# Patient Record
Sex: Female | Born: 1985
Health system: Southern US, Community
[De-identification: ages and names within clinical notes are randomized; demographics above are authoritative.]

## PROBLEM LIST (undated history)

## (undated) DIAGNOSIS — F329 Major depressive disorder, single episode, unspecified: Secondary | ICD-10-CM

## (undated) DIAGNOSIS — F32A Depression, unspecified: Secondary | ICD-10-CM

## (undated) DIAGNOSIS — F419 Anxiety disorder, unspecified: Secondary | ICD-10-CM

---
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
## 2004-04-14 ENCOUNTER — Inpatient Hospital Stay (HOSPITAL_COMMUNITY): Admission: AD | Admit: 2004-04-14 | Discharge: 2004-04-14 | Payer: Self-pay | Admitting: Obstetrics

## 2004-04-22 ENCOUNTER — Inpatient Hospital Stay (HOSPITAL_COMMUNITY): Admission: AD | Admit: 2004-04-22 | Discharge: 2004-04-26 | Payer: Self-pay | Admitting: Obstetrics

## 2004-08-09 ENCOUNTER — Emergency Department (HOSPITAL_COMMUNITY): Admission: EM | Admit: 2004-08-09 | Discharge: 2004-08-09 | Payer: Self-pay | Admitting: Emergency Medicine

## 2004-12-27 ENCOUNTER — Emergency Department (HOSPITAL_COMMUNITY): Admission: EM | Admit: 2004-12-27 | Discharge: 2004-12-27 | Payer: Self-pay | Admitting: Family Medicine

## 2005-02-08 ENCOUNTER — Emergency Department (HOSPITAL_COMMUNITY): Admission: EM | Admit: 2005-02-08 | Discharge: 2005-02-08 | Payer: Self-pay | Admitting: Family Medicine

## 2005-05-06 ENCOUNTER — Emergency Department (HOSPITAL_COMMUNITY): Admission: EM | Admit: 2005-05-06 | Discharge: 2005-05-06 | Payer: Self-pay | Admitting: Emergency Medicine

## 2006-03-07 ENCOUNTER — Ambulatory Visit: Payer: Self-pay | Admitting: Gynecology

## 2006-04-17 ENCOUNTER — Inpatient Hospital Stay (HOSPITAL_COMMUNITY): Admission: AD | Admit: 2006-04-17 | Discharge: 2006-04-17 | Payer: Self-pay | Admitting: Obstetrics and Gynecology

## 2006-06-17 ENCOUNTER — Inpatient Hospital Stay (HOSPITAL_COMMUNITY): Admission: AD | Admit: 2006-06-17 | Discharge: 2006-06-17 | Payer: Self-pay | Admitting: Obstetrics and Gynecology

## 2006-07-08 ENCOUNTER — Emergency Department (HOSPITAL_COMMUNITY): Admission: EM | Admit: 2006-07-08 | Discharge: 2006-07-08 | Payer: Self-pay | Admitting: Family Medicine

## 2006-10-11 ENCOUNTER — Inpatient Hospital Stay (HOSPITAL_COMMUNITY): Admission: RE | Admit: 2006-10-11 | Discharge: 2006-10-14 | Payer: Self-pay | Admitting: Obstetrics and Gynecology

## 2007-03-25 ENCOUNTER — Emergency Department (HOSPITAL_COMMUNITY): Admission: EM | Admit: 2007-03-25 | Discharge: 2007-03-25 | Payer: Self-pay | Admitting: Family Medicine

## 2010-02-18 ENCOUNTER — Emergency Department (HOSPITAL_COMMUNITY): Admission: EM | Admit: 2010-02-18 | Discharge: 2010-02-18 | Payer: Self-pay | Admitting: Family Medicine

## 2011-01-10 LAB — WET PREP, GENITAL
Clue Cells Wet Prep HPF POC: NONE SEEN
Trich, Wet Prep: NONE SEEN
WBC, Wet Prep HPF POC: NONE SEEN
Yeast Wet Prep HPF POC: NONE SEEN

## 2011-01-10 LAB — POCT URINALYSIS DIP (DEVICE)
Bilirubin Urine: NEGATIVE
Glucose, UA: NEGATIVE mg/dL
Hgb urine dipstick: NEGATIVE
Ketones, ur: NEGATIVE mg/dL
Nitrite: NEGATIVE
Protein, ur: NEGATIVE mg/dL
Specific Gravity, Urine: 1.015 (ref 1.005–1.030)
Urobilinogen, UA: 0.2 mg/dL (ref 0.0–1.0)
pH: 7 (ref 5.0–8.0)

## 2011-01-10 LAB — GC/CHLAMYDIA PROBE AMP, GENITAL
Chlamydia, DNA Probe: NEGATIVE
GC Probe Amp, Genital: NEGATIVE

## 2011-01-10 LAB — POCT PREGNANCY, URINE: Preg Test, Ur: NEGATIVE

## 2011-03-10 NOTE — Discharge Summary (Signed)
NAME:  Ashley Dawson, Ashley Dawson                       ACCOUNT NO.:  [id number]   MEDICAL RECORD NO.:  [id number]                   PATIENT TYPE:  INP   LOCATION:  9140                                 [facility]ILITY:  [facility]   PHYSICIAN:  Kathreen Cosier, M.D.           DATE OF BIRTH:  1986/07/28   DATE OF ADMISSION:  04/22/2004  DATE OF DISCHARGE:                                 DISCHARGE SUMMARY   HOSPITAL COURSE:  The patient is a 25 year old primigravida with [facility] April 22, 2004 in for induction.  Cervix was closed, vertex -1.  She had a negative  GBS.  She had Cytotec placed x2 on the night of July 1 and by 6 a.m. on July  2 she was 1 cm, 100%, vertex, -1 to -2.  Membranes were artificially  ruptured.  The fluid was meconium stained.  The patient received Pitocin  stimulation and eventually underwent a primary low transverse cesarean  section for failure to progress in labor.  She delivered a female, Apgars 9  and 9, weighing 7 pounds 7 ounces from the OP position on July 2.  Postoperatively she did well.  Her hemoglobin on postoperative day #2 was  11.6.  She was discharged on postoperative day #3, ambulatory, on a regular  diet, without complaints, on Tylox for pain, to see me in 6 weeks.   DISCHARGE DIAGNOSIS:  Status post primary low transverse cesarean section at  term for cephalopelvic disproportion.                                               Kathreen Cosier, M.D.    BAM/MEDQ  D:  04/26/2004  T:  04/26/2004  Job:  [id number]

## 2011-03-10 NOTE — Op Note (Signed)
NAME:  Ashley Dawson, Ashley Dawson                       ACCOUNT NO.:  [id number]   MEDICAL RECORD NO.:  [id number]                   PATIENT TYPE:  INP   LOCATION:  9140                                 [facility]   PHYSICIAN:  Kathreen Cosier, M.D.           DATE OF BIRTH:  [birth year]   DATE OF PROCEDURE:  04/23/2004  DATE OF DISCHARGE:                                 OPERATIVE REPORT   PREOPERATIVE DIAGNOSIS:  Failure to progress in labor.   ANESTHESIA:  Epidural.   SURGEON:  Dr. Gaynell Face.   PROCEDURE:  The patient was placed on the operating room table in the supine  position.  The abdomen was prepped and draped.  The bladder was emptied with  a Foley catheter.  A transverse suprapubic incision was made, carried down  through the rectus fascia.  The fascia was cleaned and incised throughout  the length of the incision.  The recti muscles were retracted laterally.  The peritoneum was incised longitudinally.  A transverse incision was made  in the viscera and peritoneum above the bladder and the bladder mobilized  inferiorly.  A transverse lower uterine incision was made.  The fluid was  cleared.  The patient was delivered from the OP position, a female weighing  7 pounds 7 ounces.  Apgars were 9 and 9, given to the team in attendance.  The placenta was posterior and removed manually.  The uterine cavity was  cleaned with dry laps.  The uterine incision was closed in a wide layer with  continuous #1 chromic.  Hemostasis was satisfactory.  The bladder flap was  reattached with 2-0 chromic.  The uterus was well contracted.  Tubes and  ovaries normal.  The abdomen was closed in layers.  The peritoneum was  continuously closed with 2-0 chromic.  The fascia with continuous 2-0 Dexon  and the skin closed with subcuticular stitch of 3-0 Monocryl.  Blood loss  was 600 cc.                                               Kathreen Cosier, M.D.    BAM/MEDQ  D:  04/23/2004  T:  04/25/2004   Job:  [id number]

## 2011-03-10 NOTE — Op Note (Signed)
NAMEADALIE, MAND             ACCOUNT NO.:  [id number]   MEDICAL RECORD NO.:  [id number]          PATIENT TYPE:  INP   LOCATION:  9131                          [facility]ILITY[facility]  [facility]   PHYSICIAN:  Janine Limbo, M.D.DATE OF BIRTH:  06-13-1986   DATE OF PROCEDURE:  10/11/2006  DATE OF DISCHARGE:                               OPERATIVE REPORT   PREOPERATIVE DIAGNOSIS:  1. Term intrauterine gestation.  2. Prior cesarean section.  3. Desires repeat cesarean section.  4. Obesity (weight is 273 pounds, height is 5 feet 2 inches).  5. Narrow pelvic outlet.   POSTOPERATIVE DIAGNOSIS:  1. Term intrauterine gestation.  2. Prior cesarean section.  3. Desires repeat cesarean section.  4. Obesity (weight is 273 pounds, height is 5 feet 2 inches).  5. Narrow pelvic outlet.   PROCEDURE:  Repeat low transverse cesarean section.   SURGEON:  Janine Limbo, M.D.   FIRST ASSISTANT:  Marie L. Williams, C.N.M.   ANESTHESIA:  Spinal.   DISPOSITION:  [name] is a 25 year old female, gravida 2, para 1-0-0-  1, who presents at [redacted] weeks gestation (EDC is October 13, 2006) for  repeat cesarean section.  The patient has had a prior cesarean section.  On examination, the patient's cervix was noted to be closed, 25% at  base, and the fetal head is at a -3 station.  The pelvic outlet was  noted to be narrow.  The patient understands the indications for her  surgical procedure and she accepts the risks of, but not limited to,  anesthetic complications, bleeding, infection, and possible damage to  surrounding organs.   FINDINGS:  A 7 pounds 14 ounces female infant Heloise Purpura) was delivered from  a cephalic presentation.  The Apgars were 9 at 1 minute and 9 at 5  minutes.  The uterus, fallopian tubes, and ovaries were normal for the  gravid state.   DESCRIPTION OF PROCEDURE:  The patient was taken to the operating room  where a spinal anesthetic was given.  The patient's abdomen, perineum,  and  vagina were prepped with multiple layers of Betadine.  A Foley  catheter was placed in the bladder.  The patient was then sterilely  draped.  The lower abdomen was injected with 10 mL of 0.5% Marcaine with  epinephrine.  A low transverse incision was made in the abdomen and  carried sharply through the subcutaneous tissue, fascia, and the  anterior peritoneum.  The bladder flap was developed.  An incision was  made in the lower uterine segment and extended in a low transverse  fashion.  The fetal head was delivered with the assistance of a Mityvac  vacuum extractor.  The mouth and nose were suctioned.  The remainder of  the infant was then delivered.  The cord was clamped and cut and the  infant was handed to the awaiting pediatric team.  The placenta was  removed and routine cord blood studies were obtained.  The placenta was  then sent to labor and delivery.  The uterine cavity was cleaned of  amniotic fluid, clotted blood, and membranes.  The uterine  incision was  closed using a running locking suture of 2-0 Vicryl followed by an  imbricating suture of 2-0 Vicryl.  The pelvis was vigorously irrigated.  Hemostasis was adequate throughout.  The anterior peritoneum and the  abdominal musculature were reapproximated in the midline using 2-0  Vicryl.  The fascia and the abdominal musculature were irrigated.  The  fascia was closed using a running suture of 0 Vicryl followed by three  interrupted sutures of 0 Vicryl.  The subcutaneous layer was noted to be  hemostatic.  A Jackson-Pratt drain was placed in the subcutaneous layer  and brought out through the left lower quadrant.  The subcutaneous layer  was closed using a running suture of 0 Vicryl.  The skin was  reapproximated using a subcuticular suture of 3-0 Monocryl.  The Al Pimple drain was sutured into place using 3-0 silk.  Sponge, needle, and  instrument counts were correct on two occasions.  The estimated blood  loss for the  procedure was approximately 700 mL.  The estimated urine  output was 200 mL and it was clear.  The patient was taken to the  recovery room in stable condition.  The infant was taken to the full  term nursery in stable condition.      Janine Limbo, M.D.  Electronically Signed     AVS/MEDQ  D:  10/11/2006  T:  10/11/2006  Job:  [id number]

## 2011-03-10 NOTE — H&P (Signed)
NAME:  Ashley Dawson, Ashley Dawson                       ACCOUNT NO.:  [id number]   MEDICAL RECORD NO.:  [id number]                   PATIENT TYPE:  INP   LOCATION:  9140                                 [facility]   PHYSICIAN:  Kathreen Cosier, M.D.           DATE OF BIRTH:  06-Jan-1986   DATE OF ADMISSION:  04/22/2004  DATE OF DISCHARGE:                                HISTORY & PHYSICAL   HISTORY OF PRESENT ILLNESS:  The patient is a 25 year old gravida 1  estimated date of confinement April 22, 2004 who was admitted for induction.  Cervix was closed, vertex minus 1, negative GBS. On the night of admission,  she had Cytotec placed x2 and by 6:00 a.m. on April 23, 2004 she was 1 cm,  100% vertex, minus 1 to 2 station. Membranes were ruptured artificially and  the fluid was slightly meconium stained. She was placed on Pitocin. She was  having irregular contractions, so she had Pitocin stimulation. The patient  progressed to 6 cm and over the next 4 hours in good labor, did not make any  further progress. Temperature was 101 plus. Given Tylenol and Ampicillin IV  2 grams. It was decided that she would be delivered by cesarean section for  failure to progress in labor.   PHYSICAL EXAMINATION:  GENERAL:  A well developed female in labor.  HEENT:  Negative.  LUNGS:  Clear.  HEART:  Regular rhythm. No murmurs. No gallops.  BREASTS:  Engorged.  ABDOMEN:  Term size uterus. Estimated fetal weight 7 pounds 6 ounces.  PELVIC:  Cervix as described above.  EXTREMITIES:  Negative.                                               Kathreen Cosier, M.D.    BAM/MEDQ  D:  04/23/2004  T:  04/24/2004  Job:  [id number]

## 2011-03-10 NOTE — H&P (Signed)
NAMECAMIL, Ashley Dawson             ACCOUNT NO.:  [id number]   MEDICAL RECORD NO.:  [id number]          PATIENT TYPE:  INP   LOCATION:  NA                            [facility]   PHYSICIAN:  Janine Limbo, M.D.DATE OF BIRTH:  09-10-1986   DATE OF ADMISSION:  10/11/2006  DATE OF DISCHARGE:                              HISTORY & PHYSICAL   HISTORY OF PRESENT ILLNESS:  Ashley Dawson is a 25 year old female, gravida  2, para 1, 0, 0, 1, who presents at [redacted] weeks gestation (EDC is October 11, 2006) for repeat cesarean section. The patient has been followed at  the [facility] and [facility] of [facility]. The pregnancy has been largely uncomplicated. The  patient has had a prior cesarean section for failure to progress in  labor.   OBSTETRICAL HISTORY:  The patient had a cesarean section at term in 2005  where she delivered a 7 pound, 7 ounce female infant. She failed to  progress in labor.   ALLERGIES:  No known drug allergies.   PAST MEDICAL HISTORY:  The patient denies hypertension and diabetes. She  does have a history of sickle cell trait.   REVIEW OF SYSTEMS:  Normal pregnancy complaints.   SOCIAL HISTORY:  The patient denies cigarette use, alcohol use, and  recreational drug use.   FAMILY HISTORY:  Noncontributory.   PHYSICAL EXAMINATION:  VITAL SIGNS: Height is 5 feet, 2 inches, weight  is 273 pounds.  HEENT: Within normal limits.  CHEST: Clear.  HEART: Regular rate and rhythm.  BREASTS: Are without masses.  ABDOMEN: Gravid with a fundal height of 40 cm.  EXTREMITIES: Are grossly normal.  NEUROLOGIC EXAM: Grossly normal.  PELVIC EXAM: Cervix is closed, 25% effaced, and -3 in station.   LABORATORY VALUES:  Blood type is O positive, antibody screen negative.  Sickle cell shows sickle cell trait. VDRL nonreactive. Rubella positive.  HBSAG negative. HIV nonreactive. GC negative, Chlamydia negative. Alpha  fetoprotein  screen within normal limits. Third trimester beta strep is  negative. Third trimester gonorrhea negative, third trimester Chlamydia  is negative.   ASSESSMENT:  1. Term intrauterine gestation.  2. Prior cesarean section.  3. Desires repeat cesarean section.  4. Obesity.  5. Unfavorable cervix and narrow pelvic outlet.   PLAN:  The patient will undergo a repeat cesarean section. She  understands the indications for her surgical procedure and she accepts  the risk of, but not limited to, anesthetic complications, bleeding,  infections, and possible damage to the surrounding organs.      Janine Limbo, M.D.  Electronically Signed     AVS/MEDQ  D:  10/10/2006  T:  10/10/2006  Job:  [id number]

## 2011-03-10 NOTE — Discharge Summary (Signed)
NAMEHOLIDAY, MCMENAMIN             ACCOUNT NO.:  [id number]   MEDICAL RECORD NO.:  [id number]          PATIENT TYPE:  INP   LOCATION:  9131                          [facility]   PHYSICIAN:  Janine Limbo, M.D.DATE OF BIRTH:  1985/10/24   DATE OF ADMISSION:  10/11/2006  DATE OF DISCHARGE:  10/14/2006                               DISCHARGE SUMMARY   ADMITTING DIAGNOSES:  1. Intrauterine pregnancy at term.  2. Previous cesarean section, desires repeat.  3. Obesity and narrow pelvis.   PROCEDURE:  Repeat low transverse cesarean section.   DISCHARGE DIAGNOSES:  1. Intrauterine pregnancy at term, delivered.  2. Previous cesarean  section, desires repeat.  3. Obesity with narrow pelvis.  4. Repeat low transverse cesarean section.   [name] is a 25 year old gravida 2, para 2-0-0-2 who presented for  repeat cesarean delivery on October 11, 2006.  This was accomplished by  Dr. Marline Backbone as surgeon.  The patient gave birth to a 7 pound 14  ounce female infant named Jayden with Apgar scores of 9 at 1-minute and 9  at 5 minutes.  The patient's vital signs have remained stable.  She has  been afebrile.  Her hemoglobin on the first postoperative day is 10.8.  Her incision is clean, dry and intact.  Her Jackson-Pratt drain was  removed easily on this her third postoperative day, and she is judged to  be in satisfactory condition for discharge.   DISCHARGE INSTRUCTIONS:  Per [facility] handout.   DISCHARGE MEDICATIONS:  1. Motrin 600 mg p.o. q.6 h p.r.n. pain.  2. Tylox one to two p.o. q.3-4 h p.r.n. pain.  3. Prenatal vitamins.      Rica Koyanagi, C.N.M.      Janine Limbo, M.D.  Electronically Signed    SDM/MEDQ  D:  10/14/2006  T:  10/14/2006  Job:  [id number]

## 2011-08-10 LAB — CULTURE, ROUTINE-ABSCESS: Gram Stain: NONE SEEN

## 2012-08-23 ENCOUNTER — Other Ambulatory Visit (HOSPITAL_COMMUNITY)
Admission: RE | Admit: 2012-08-23 | Discharge: 2012-08-23 | Disposition: A | Discharge status: Home / Self Care | Payer: BC Managed Care – PPO | Source: Ambulatory Visit | Attending: Family Medicine | Admitting: Family Medicine

## 2012-08-23 DIAGNOSIS — Z124 Encounter for screening for malignant neoplasm of cervix: Secondary | ICD-10-CM | POA: Insufficient documentation

## 2012-08-23 DIAGNOSIS — Z113 Encounter for screening for infections with a predominantly sexual mode of transmission: Secondary | ICD-10-CM | POA: Insufficient documentation

## 2013-06-09 ENCOUNTER — Other Ambulatory Visit: Payer: Self-pay | Admitting: Family Medicine

## 2013-06-09 DIAGNOSIS — N644 Mastodynia: Secondary | ICD-10-CM

## 2013-06-09 DIAGNOSIS — R2231 Localized swelling, mass and lump, right upper limb: Secondary | ICD-10-CM

## 2013-06-16 ENCOUNTER — Ambulatory Visit
Admission: RE | Admit: 2013-06-16 | Discharge: 2013-06-16 | Disposition: A | Discharge status: Home / Self Care | Payer: BC Managed Care – PPO | Source: Ambulatory Visit | Attending: Family Medicine | Admitting: Family Medicine

## 2013-06-16 DIAGNOSIS — N644 Mastodynia: Secondary | ICD-10-CM

## 2013-06-16 DIAGNOSIS — R2231 Localized swelling, mass and lump, right upper limb: Secondary | ICD-10-CM

## 2014-11-27 ENCOUNTER — Other Ambulatory Visit (HOSPITAL_COMMUNITY)
Admission: RE | Admit: 2014-11-27 | Discharge: 2014-11-27 | Disposition: A | Discharge status: Home / Self Care | Payer: BC Managed Care – PPO | Source: Ambulatory Visit | Attending: Family Medicine | Admitting: Family Medicine

## 2014-11-27 ENCOUNTER — Other Ambulatory Visit: Payer: Self-pay | Admitting: Family Medicine

## 2014-11-27 DIAGNOSIS — Z01419 Encounter for gynecological examination (general) (routine) without abnormal findings: Secondary | ICD-10-CM | POA: Diagnosis not present

## 2014-11-27 DIAGNOSIS — Z113 Encounter for screening for infections with a predominantly sexual mode of transmission: Secondary | ICD-10-CM | POA: Insufficient documentation

## 2014-12-01 LAB — CYTOLOGY - PAP

## 2016-08-01 ENCOUNTER — Ambulatory Visit (HOSPITAL_COMMUNITY)
Admission: EM | Admit: 2016-08-01 | Discharge: 2016-08-01 | Disposition: A | Discharge status: Home / Self Care | Payer: BC Managed Care – PPO | Attending: Physician Assistant | Admitting: Physician Assistant

## 2016-08-01 ENCOUNTER — Encounter (HOSPITAL_COMMUNITY): Payer: Self-pay | Admitting: Emergency Medicine

## 2016-08-01 DIAGNOSIS — R238 Other skin changes: Secondary | ICD-10-CM | POA: Diagnosis not present

## 2016-08-01 MED ORDER — MUPIROCIN CALCIUM 2 % EX CREA: 1.0000 "application " | TOPICAL_CREAM | Freq: Two times a day (BID) | CUTANEOUS | 0 refills | Status: DC

## 2016-08-01 NOTE — Discharge Instructions (Signed)
Use antibiotic ointment to the sore area twice daily.   Follow up with GYN if symptoms persist.

## 2016-08-01 NOTE — ED Notes (Signed)
D/c by Frank Patrick, PA  

## 2016-08-01 NOTE — ED Provider Notes (Signed)
CSN: [id number]     Arrival date & time 08/01/16  1008 History   First MD Initiated Contact with Patient 08/01/16 1105     Chief Complaint  Patient presents with  . Vaginitis   (Consider location/radiation/quality/duration/timing/severity/associated sxs/prior Treatment) HPI This is a new problem 30 y/o female with labia swelling Friday and Saturday, now resolved. Also sore area outer labia no change in sexual partner, no discharge.  History reviewed. No pertinent past medical history. History reviewed. No pertinent surgical history. History reviewed. No pertinent family history. Social History  Substance Use Topics  . Smoking status: Never Smoker  . Smokeless tobacco: Never Used  . Alcohol use No   OB History    No data available     Review of Systems  Denies: HEADACHE, NAUSEA, ABDOMINAL PAIN, CHEST PAIN, CONGESTION, DYSURIA, SHORTNESS OF BREATH  Allergies  Review of patient's allergies indicates no known allergies.  Home Medications   Prior to Admission medications   Not on File   Meds Ordered and Administered this Visit  Medications - No data to display  BP 103/78 (BP Location: Right Arm)   Pulse 84   Temp 98.6 F (37 C) (Oral)   Resp 16   SpO2 100%  No data found.   Physical Exam NURSES NOTES AND VITAL SIGNS REVIEWED. CONSTITUTIONAL: Well developed, well nourished, no acute distress HEENT: normocephalic, atraumatic EYES: Conjunctiva normal NECK:normal ROM, supple, no adenopathy PULMONARY:No respiratory distress, normal effort ABDOMINAL: Soft, ND, NT BS+, No CVAT MUSCULOSKELETAL: Normal ROM of all extremities,  SKIN: warm and dry without rash PSYCHIATRIC: Mood and affect, behavior are normal NURSES NOTES AND VITAL SIGNS REVIEWED. CONSTITUTIONAL: Well developed, well nourished, no acute distress HEENT: normocephalic, atraumatic, right and left TM's are normal EYES: Conjunctiva normal NECK:normal ROM, supple, no adenopathy PULMONARY:No respiratory  distress, normal effort Exam is performed with patient's permission Female nursing staff present to chaperone.  Perineum: clean, dry without lesions, no groin or inguinal Lymphadenopathy; urethra . No caruncle or prolapse noted. Right medial thigh near right labia is a red raw area the chaffing. No signs of herpes or other infection.      Urgent Care Course   Clinical Course    Procedures (including critical care time)  Labs Review Labs Reviewed - No data to display  Imaging Review No results found.   Visual Acuity Review  Right Eye Distance:   Left Eye Distance:   Bilateral Distance:    Right Eye Near:   Left Eye Near:    Bilateral Near:         MDM   1. Skin irritation     Patient is reassured that there are no issues that require transfer to higher level of care at this time or additional tests. Patient is advised to continue home symptomatic treatment. Patient is advised that if there are new or worsening symptoms to attend the emergency department, contact primary care provider, or return to UC. Instructions of care provided discharged home in stable condition.    THIS NOTE WAS GENERATED USING A VOICE RECOGNITION SOFTWARE PROGRAM. ALL REASONABLE EFFORTS  WERE MADE TO PROOFREAD THIS DOCUMENT FOR ACCURACY.  I have verbally reviewed the discharge instructions with the patient. A printed AVS was given to the patient.  All questions were answered prior to discharge.      Tharon AquasFrank C Brenda Samano, PA 08/01/16 1113

## 2016-08-01 NOTE — ED Triage Notes (Signed)
Pt is here for vaginal irritation/itching onset 2 weeks  Denies vag d/c, urinary sx, fevers, chills, abd/back pain  Pt is SA in a monogamous relationship  A&O x4... NAD

## 2017-05-08 ENCOUNTER — Encounter (HOSPITAL_COMMUNITY): Payer: Self-pay | Admitting: Emergency Medicine

## 2017-05-08 ENCOUNTER — Emergency Department (HOSPITAL_COMMUNITY): Payer: BC Managed Care – PPO

## 2017-05-08 ENCOUNTER — Other Ambulatory Visit: Payer: Self-pay

## 2017-05-08 ENCOUNTER — Emergency Department (HOSPITAL_BASED_OUTPATIENT_CLINIC_OR_DEPARTMENT_OTHER)
Admit: 2017-05-08 | Discharge: 2017-05-08 | Disposition: A | Discharge status: Home / Self Care | Payer: BC Managed Care – PPO

## 2017-05-08 ENCOUNTER — Emergency Department (HOSPITAL_COMMUNITY)
Admission: EM | Admit: 2017-05-08 | Discharge: 2017-05-08 | Disposition: A | Discharge status: Home / Self Care | Payer: BC Managed Care – PPO | Attending: Emergency Medicine | Admitting: Emergency Medicine

## 2017-05-08 DIAGNOSIS — M79605 Pain in left leg: Secondary | ICD-10-CM | POA: Diagnosis not present

## 2017-05-08 DIAGNOSIS — M79609 Pain in unspecified limb: Secondary | ICD-10-CM

## 2017-05-08 DIAGNOSIS — M7989 Other specified soft tissue disorders: Secondary | ICD-10-CM | POA: Diagnosis not present

## 2017-05-08 DIAGNOSIS — M79604 Pain in right leg: Secondary | ICD-10-CM | POA: Insufficient documentation

## 2017-05-08 DIAGNOSIS — R0789 Other chest pain: Secondary | ICD-10-CM | POA: Insufficient documentation

## 2017-05-08 DIAGNOSIS — R55 Syncope and collapse: Secondary | ICD-10-CM | POA: Insufficient documentation

## 2017-05-08 LAB — CBC WITH DIFFERENTIAL/PLATELET
Basophils Absolute: 0 10*3/uL (ref 0.0–0.1)
Basophils Relative: 0 %
Eosinophils Absolute: 0.2 10*3/uL (ref 0.0–0.7)
Eosinophils Relative: 2 %
HCT: 42.5 % (ref 36.0–46.0)
Hemoglobin: 14.3 g/dL (ref 12.0–15.0)
Lymphocytes Relative: 29 %
Lymphs Abs: 2.2 10*3/uL (ref 0.7–4.0)
MCH: 27.6 pg (ref 26.0–34.0)
MCHC: 33.6 g/dL (ref 30.0–36.0)
MCV: 82 fL (ref 78.0–100.0)
Monocytes Absolute: 0.6 10*3/uL (ref 0.1–1.0)
Monocytes Relative: 8 %
Neutro Abs: 4.7 10*3/uL (ref 1.7–7.7)
Neutrophils Relative %: 61 %
Platelets: 284 10*3/uL (ref 150–400)
RBC: 5.18 MIL/uL — ABNORMAL HIGH (ref 3.87–5.11)
RDW: 14.8 % (ref 11.5–15.5)
WBC: 7.7 10*3/uL (ref 4.0–10.5)

## 2017-05-08 LAB — APTT: aPTT: 31 seconds (ref 24–36)

## 2017-05-08 LAB — COMPREHENSIVE METABOLIC PANEL
ALT: 17 U/L (ref 14–54)
AST: 19 U/L (ref 15–41)
Albumin: 4.4 g/dL (ref 3.5–5.0)
Alkaline Phosphatase: 68 U/L (ref 38–126)
Anion gap: 9 (ref 5–15)
BUN: 13 mg/dL (ref 6–20)
CO2: 29 mmol/L (ref 22–32)
Calcium: 9.5 mg/dL (ref 8.9–10.3)
Chloride: 102 mmol/L (ref 101–111)
Creatinine, Ser: 0.92 mg/dL (ref 0.44–1.00)
GFR calc Af Amer: >60 mL/min (ref >60)
GFR calc non Af Amer: >60 mL/min (ref >60)
Glucose, Bld: 91 mg/dL (ref 65–99)
Potassium: 4 mmol/L (ref 3.5–5.1)
Sodium: 140 mmol/L (ref 135–145)
Total Bilirubin: 0.4 mg/dL (ref 0.3–1.2)
Total Protein: 8.2 g/dL — ABNORMAL HIGH (ref 6.5–8.1)

## 2017-05-08 LAB — PROTIME-INR
INR: 0.97
Prothrombin Time: 12.8 seconds (ref 11.4–15.2)

## 2017-05-08 LAB — I-STAT TROPONIN, ED: Troponin i, poc: 0 ng/mL (ref 0.00–0.08)

## 2017-05-08 LAB — I-STAT BETA HCG BLOOD, ED (MC, WL, AP ONLY): I-stat hCG, quantitative: <5 m[IU]/mL (ref <5)

## 2017-05-08 MED ORDER — IOPAMIDOL (ISOVUE-370) INJECTION 76%
INTRAVENOUS | Status: AC
  Administered 2017-05-08: 100 mL
  Filled 2017-05-08: qty 100

## 2017-05-08 NOTE — ED Provider Notes (Signed)
MSE was initiated and I personally evaluated the patient and placed orders (if any) at  3:04 PM on May 08, 2017.  Ashley Dawson is a 31 y.o. female, patient with no pertinent past medical history, presenting to the ED with chest pain that began during a cross[place]country plane ride 4 days ago. Patient states that as they were about to land she began to feel an intense aching in the center of her chest accompanied by nausea. After she got off the plane and was walking to meet her connecting flight, she states she suddenly became short of breath and had a syncopal episode with loss of consciousness for an unknown amount of time.  Prior to this, patient endorses an aching that began in her right thigh and radiated into her right calf. Her left leg also began to ache. She also endorses some swelling in her legs. [facility] days ago where she had a chest x-ray and lab work performed. She was told the chest x-ray was normal, however, today someone from the clinic called her and told her that her d-dimer was positive and she would need to come to the ED to rule out PE.  She currently endorses central chest pain rated 4 out of 10, described as aching, nonradiating. She will still get short of breath with fast walking or with stairs, but none at rest. She endorses aching, 4 out of 10 right thigh pain, radiating into the right calf. She states her left leg pain is only intermittent.   She denies history of PE/DVT. Does not use any hormonal therapy. Nonsmoker. LMP in the middle of June, but is irregular.  Physical Exam  Constitutional: She is well-developed, well-nourished, and in no distress. No distress.  HENT:  Head: Normocephalic and atraumatic.  Eyes: Pupils are equal, round, and reactive to light. Conjunctivae and EOM are normal.  Neck: Normal range of motion. Neck supple.  Cardiovascular: Normal rate, regular rhythm, normal heart sounds  and intact distal pulses.   Pulmonary/Chest: Effort normal and breath sounds normal. No respiratory distress. She exhibits no tenderness.  No increased work of breathing. Patient speaks in full sentences.  Abdominal: Bowel sounds are normal. She exhibits no distension. There is no tenderness.  Musculoskeletal: She exhibits no tenderness.  No noted tenderness over the deep vein system of the lower extremities. Assessment made more difficult due to patient's obesity.  Lymphadenopathy:    She has no cervical adenopathy.  Neurological: She is alert.  Skin: Skin is warm and dry. She is not diaphoretic.  Psychiatric: Mood and affect normal.  Nursing note and vitals reviewed.    The patient appears stable so that the remainder of the MSE may be completed by another provider.  Patient presents from a doctor's office for PE/DVT rule out. Pertinent orders placed. No acute distress.  Vitals:   05/08/17 1441  BP: 126/84  Pulse: 88  Resp: 16  Temp: 97.7 F (36.5 C)  TempSrc: Oral  SpO2: 100%  Weight: 113.4 kg (250 lb)  Height: 5\' 6"  (1.676 m)      Anselm PancoastJoy, Shawn C, PA-C 05/08/17 1517    Derwood KaplanNanavati, Ankit, MD 05/09/17 [phone]

## 2017-05-08 NOTE — ED Notes (Signed)
Bed: [facility]4 Expected date:  Expected time:  Means of arrival:  Comments: 

## 2017-05-08 NOTE — ED Notes (Signed)
Per PCP-patient had a positive D-Dimer-coming here for further eval

## 2017-05-08 NOTE — Progress Notes (Signed)
*  Preliminary Results* Bilateral lower extremity venous duplex completed. Bilateral lower extremities are negative for deep vein thrombosis. There is no evidence of Baker's cyst bilaterally.  05/08/2017 3:42 PM Gertie FeyMichelle Anselm Aumiller, BS, RVT, RDCS, RDMS

## 2017-05-08 NOTE — ED Provider Notes (Signed)
[facility]-EMERGENCY DEPT Provider Note   CSN: [id number] Arrival date & time: 05/08/17  1431     History   Chief Complaint Chief Complaint  Patient presents with  . sent by [facility] rule out PE    HPI Ashley Dawson is a 31 y.o. female.  HPI  31 year old female with no pertinent past medical history presents to the ED after being referred by her primary care provider to rule out pulmonary embolism/DVT. She presented to her PCPs office 4 days ago for chest pain, lower extremity pain, and a syncopal episode while trying to make a connecting flight from [place]. She reports that they obtained labs with elevated d-dimer and a chest x-ray concerning for PE.  She reports that the chest pain is aching in nature, nonradiating, nonexertional, constant and fluctuating for 4 days. No alleviating or aggravating factors. No current associated shortness of breath, palpitations, nausea, vomiting, diaphoresis. Patient denied any prior cardiac etiology. No family history of early heart attacks. She denies any history of hypertension, hyperlipidemia, diabetes, smoking.  History reviewed. No pertinent past medical history.  There are no active problems to display for this patient.   History reviewed. No pertinent surgical history.  OB History    No data available       Home Medications    Prior to Admission medications   Medication Sig Start Date End Date Taking? Authorizing Provider  mupirocin cream (BACTROBAN) 2 % Apply 1 application topically 2 (two) times daily. Patient not taking: Reported on 05/08/2017 08/01/16   Tharon AquasPatrick, Frank C, PA    Family History No family history on file.  Social History Social History  Substance Use Topics  . Smoking status: Never Smoker  . Smokeless tobacco: Never Used  . Alcohol use No     Allergies   Patient has no known allergies.   Review of Systems Review of Systems All other systems are reviewed and are negative for acute change except as  noted in the HPI   Physical Exam Updated Vital Signs BP 126/84 (BP Location: Left Arm)   Pulse 88   Temp 97.7 F (36.5 C) (Oral)   Resp 16   Ht 5\' 6"  (1.676 m)   Wt 113.4 kg (250 lb)   SpO2 100%   BMI 40.35 kg/m   Physical Exam  Constitutional: She is oriented to person, place, and time. She appears well-developed and well-nourished. No distress.  obese  HENT:  Head: Normocephalic and atraumatic.  Nose: Nose normal.  Eyes: Pupils are equal, round, and reactive to light. Conjunctivae and EOM are normal. Right eye exhibits no discharge. Left eye exhibits no discharge. No scleral icterus.  Neck: Normal range of motion. Neck supple.  Cardiovascular: Normal rate and regular rhythm.  Exam reveals no gallop and no friction rub.   No murmur heard. Pulmonary/Chest: Effort normal and breath sounds normal. No stridor. No respiratory distress. She has no rales.  Abdominal: Soft. She exhibits no distension. There is no tenderness.  Musculoskeletal: She exhibits no edema or tenderness.  Neurological: She is alert and oriented to person, place, and time.  Skin: Skin is warm and dry. No rash noted. She is not diaphoretic. No erythema.  Psychiatric: She has a normal mood and affect.  Vitals reviewed.    ED Treatments / Results  Labs (all labs ordered are listed, but only abnormal results are displayed) Labs Reviewed  COMPREHENSIVE METABOLIC PANEL - Abnormal; Notable for the following:       Result Value  Total Protein 8.2 (*)    All other components within normal limits  CBC WITH DIFFERENTIAL/PLATELET - Abnormal; Notable for the following:    RBC 5.18 (*)    All other components within normal limits  PROTIME-INR  APTT  I-STAT BETA HCG BLOOD, ED ([facility], [facility], [facility] ONLY)  I-STAT TROPOININ, ED    EKG  EKG Interpretation None       Radiology Ct Angio Chest Pe W And/or Wo Contrast  Result Date: 05/08/2017 CLINICAL DATA:  [place] distance hairline travel. Syncopal episode. Elevated  D-dimer. Bilateral leg pain and chest pain. EXAM: CT ANGIOGRAPHY CHEST WITH CONTRAST TECHNIQUE: Multidetector CT imaging of the chest was performed using the standard protocol during bolus administration of intravenous contrast. Multiplanar CT image reconstructions and MIPs were obtained to evaluate the vascular anatomy. CONTRAST:  100 cc Isovue 370 COMPARISON:  Radiography 08/09/2004 FINDINGS: Cardiovascular: Pulmonary arterial opacification is moderate to good. No pulmonary emboli. The aorta is normal. No coronary artery calcification. Heart size is normal. No pericardial fluid. Mediastinum/Nodes: No mediastinal mass or lymphadenopathy. Lungs/Pleura: The lungs are entirely clear. No infiltrate, mass or collapse. No pleural fluid or pleural mass. Upper Abdomen: Normal Musculoskeletal: Normal Review of the MIP images confirms the above findings. IMPRESSION: Normal CT angiography of the chest. No pulmonary emboli or other chest pathology identified. Electronically Signed   By: Paulina Fusi M.D.   On: 05/08/2017 19:00    Procedures Procedures (including critical care time)  Medications Ordered in ED Medications  iopamidol (ISOVUE-370) 76 % injection (100 mLs  Contrast Given 05/08/17 1845)     Initial Impression / Assessment and Plan / ED Course  I have reviewed the triage vital signs and the nursing notes.  Pertinent labs & imaging results that were available during my care of the patient were reviewed by me and considered in my medical decision making (see chart for details).  Clinical Course as of May 08 1913  Tue May 08, 2017  1553 Atypical chest pain. EKG without acute ischemic changes or pericarditis. Initial troponin negative. Since the duration of the pain for greater than 48 hours, I feel that a single troponin should be sufficient to rule out ACS.  We'll obtain CT PE study to assess for pulmonary embolism.  Presentation not classic for aortic dissection or esophageal perforation.  No  infectious symptoms concerning for pneumonia..  [PC]  1553 DVT US negative.  [PC]  1911 CT PE study without evidence of pulmonary emboli.   [PC]  1912 The patient is safe for discharge with strict return precautions.   [PC]    Clinical Course User Index [PC] Cardama, Amadeo Garnet, MD      Final Clinical Impressions(s) / ED Diagnoses   Final diagnoses:  Atypical chest pain  Bilateral leg pain   Disposition: Discharge  Condition: Good  I have discussed the results, Dx and Tx plan with the patient who expressed understanding and agree(s) with the plan. Discharge instructions discussed at great length. The patient was given strict return precautions who verbalized understanding of the instructions. No further questions at time of discharge.    New Prescriptions   No medications on file    Follow Up: Primary care provider  Schedule an appointment as soon as possible for a visit  As needed If symptoms do not improve or  worsen      Cardama, Amadeo Garnet, MD 05/08/17 1914

## 2017-05-08 NOTE — ED Triage Notes (Signed)
Patient reports that she was flying from [place] to [place] on Friday and had syncopal episode. [facility] where she had xray, EKG and labs.  Patient was called today to come to Ed due to elevated d-dimer.  Patient c/o bilat leg and chest pain since Friday.

## 2017-12-11 ENCOUNTER — Other Ambulatory Visit: Payer: Self-pay | Admitting: Family Medicine

## 2017-12-11 ENCOUNTER — Other Ambulatory Visit (HOSPITAL_COMMUNITY)
Admission: RE | Admit: 2017-12-11 | Discharge: 2017-12-11 | Disposition: A | Discharge status: Home / Self Care | Payer: BC Managed Care – PPO | Source: Ambulatory Visit | Attending: Family Medicine | Admitting: Family Medicine

## 2017-12-11 DIAGNOSIS — Z124 Encounter for screening for malignant neoplasm of cervix: Secondary | ICD-10-CM | POA: Insufficient documentation

## 2017-12-13 LAB — CYTOLOGY - PAP
Chlamydia: NEGATIVE
Diagnosis: NEGATIVE
HPV: NOT DETECTED
Neisseria Gonorrhea: NEGATIVE

## 2018-07-08 ENCOUNTER — Other Ambulatory Visit: Payer: Self-pay

## 2018-07-08 ENCOUNTER — Emergency Department (HOSPITAL_COMMUNITY)
Admission: EM | Admit: 2018-07-08 | Discharge: 2018-07-09 | Disposition: A | Discharge status: Home / Self Care | Payer: Commercial Managed Care - PPO | Attending: Emergency Medicine | Admitting: Emergency Medicine

## 2018-07-08 ENCOUNTER — Encounter (HOSPITAL_COMMUNITY): Payer: Self-pay | Admitting: Emergency Medicine

## 2018-07-08 DIAGNOSIS — F329 Major depressive disorder, single episode, unspecified: Secondary | ICD-10-CM | POA: Insufficient documentation

## 2018-07-08 DIAGNOSIS — R45851 Suicidal ideations: Secondary | ICD-10-CM | POA: Diagnosis not present

## 2018-07-08 DIAGNOSIS — F331 Major depressive disorder, recurrent, moderate: Secondary | ICD-10-CM

## 2018-07-08 HISTORY — DX: Major depressive disorder, single episode, unspecified: F32.9

## 2018-07-08 HISTORY — DX: Depression, unspecified: F32.A

## 2018-07-08 LAB — CBC
HCT: 40.9 % (ref 36.0–46.0)
Hemoglobin: 13.7 g/dL (ref 12.0–15.0)
MCH: 27.8 pg (ref 26.0–34.0)
MCHC: 33.5 g/dL (ref 30.0–36.0)
MCV: 83 fL (ref 78.0–100.0)
Platelets: 301 10*3/uL (ref 150–400)
RBC: 4.93 MIL/uL (ref 3.87–5.11)
RDW: 14.9 % (ref 11.5–15.5)
WBC: 7.5 10*3/uL (ref 4.0–10.5)

## 2018-07-08 LAB — COMPREHENSIVE METABOLIC PANEL
ALT: 12 U/L (ref 0–44)
AST: 11 U/L — ABNORMAL LOW (ref 15–41)
Albumin: 3.9 g/dL (ref 3.5–5.0)
Alkaline Phosphatase: 53 U/L (ref 38–126)
Anion gap: 9 (ref 5–15)
BUN: 13 mg/dL (ref 6–20)
CO2: 25 mmol/L (ref 22–32)
Calcium: 9.3 mg/dL (ref 8.9–10.3)
Chloride: 107 mmol/L (ref 98–111)
Creatinine, Ser: 0.99 mg/dL (ref 0.44–1.00)
GFR calc Af Amer: >60 mL/min (ref >60)
GFR calc non Af Amer: >60 mL/min (ref >60)
Glucose, Bld: 101 mg/dL — ABNORMAL HIGH (ref 70–99)
Potassium: 3.6 mmol/L (ref 3.5–5.1)
Sodium: 141 mmol/L (ref 135–145)
Total Bilirubin: 0.6 mg/dL (ref 0.3–1.2)
Total Protein: 7.2 g/dL (ref 6.5–8.1)

## 2018-07-08 LAB — ETHANOL: Alcohol, Ethyl (B): <10 mg/dL (ref <10)

## 2018-07-08 LAB — ACETAMINOPHEN LEVEL: Acetaminophen (Tylenol), Serum: <10 ug/mL — ABNORMAL LOW (ref 10–30)

## 2018-07-08 LAB — I-STAT BETA HCG BLOOD, ED (MC, WL, AP ONLY): I-stat hCG, quantitative: <5 m[IU]/mL (ref <5)

## 2018-07-08 LAB — SALICYLATE LEVEL: Salicylate Lvl: <7 mg/dL (ref 2.8–30.0)

## 2018-07-08 NOTE — ED Triage Notes (Signed)
Pt presents with suicidal ideation with plan of overdose of depression medication. Pt reports having suicidal ideation for several months.

## 2018-07-08 NOTE — ED Notes (Signed)
Bed: [id number] Expected date: 07/09/18 Expected time: 11:00 AM Means of arrival:  Comments:

## 2018-07-08 NOTE — ED Provider Notes (Signed)
[facility]-EMERGENCY DEPT Provider Note   CSN: [id number] Arrival date & time: 07/08/18  2111     History   Chief Complaint Chief Complaint  Patient presents with  . Suicidal    HPI Ashley Dawson is a 32 y.o. female.  HPI   She presents for evaluation of depression, crying, feeling like she is a failure, contemplating suicide and getting worse.  She has thought about driving her car off a bridge or taking an overdose of pills to kill herself.  She plans for suicide, and worries about how her children would feel if she killed herself.  Currently her children who are all less than 15, are at home with her mother.  Symptoms onset months to years ago, not currently being addressed.  She was taking antidepressants prescribed by her primary care provider until 2 years ago.  She is not currently seeing a therapist.  She denies recent fever, chills, cough, shortness of breath, weakness or dizziness.  There are no other known modifying factors.  Past Medical History:  Diagnosis Date  . Depression     There are no active problems to display for this patient.   History reviewed. No pertinent surgical history.   OB History   None      Home Medications    Prior to Admission medications   Medication Sig Start Date End Date Taking? Authorizing Provider  mupirocin cream (BACTROBAN) 2 % Apply 1 application topically 2 (two) times daily. Patient not taking: Reported on 05/08/2017 08/01/16   Tharon Aquas, PA    Family History History reviewed. No pertinent family history.  Social History Social History   Tobacco Use  . Smoking status: Never Smoker  . Smokeless tobacco: Never Used  Substance Use Topics  . Alcohol use: No  . Drug use: Not on file     Allergies   Patient has no known allergies.   Review of Systems Review of Systems  All other systems reviewed and are negative.    Physical Exam Updated Vital Signs BP 139/82 (BP  Location: Right Arm)   Pulse 88   Temp 98.3 F (36.8 C) (Oral)   Resp 18   Ht 5\' 3"  (1.6 m)   Wt 113.4 kg   LMP 06/07/2018   SpO2 100%   BMI 44.29 kg/m   Physical Exam  Constitutional: She is oriented to person, place, and time. She appears well-developed and well-nourished. She appears distressed (She is crying during the exam).  HENT:  Head: Normocephalic and atraumatic.  Eyes: Pupils are equal, round, and reactive to light. Conjunctivae and EOM are normal.  Neck: Normal range of motion and phonation normal. Neck supple.  Cardiovascular: Normal rate.  Pulmonary/Chest: Effort normal.  Musculoskeletal: Normal range of motion.  Neurological: She is alert and oriented to person, place, and time. She exhibits normal muscle tone.  Skin: Skin is warm and dry.  Psychiatric: Her behavior is normal. Judgment and thought content normal.  Depressed.  She is not responding to internal stimuli  Nursing note and vitals reviewed.    ED Treatments / Results  Labs (all labs ordered are listed, but only abnormal results are displayed) Labs Reviewed  COMPREHENSIVE METABOLIC PANEL - Abnormal; Notable for the following components:      Result Value   Glucose, Bld 101 (*)    AST 11 (*)    All other components within normal limits  ACETAMINOPHEN LEVEL - Abnormal; Notable for the following components:  Acetaminophen (Tylenol), Serum <10 (*)    All other components within normal limits  ETHANOL  SALICYLATE LEVEL  CBC  RAPID URINE DRUG SCREEN, HOSP PERFORMED  I-STAT BETA HCG BLOOD, ED ([facility], [facility], [facility] ONLY)    EKG None  Radiology No results found.  Procedures Procedures (including critical care time)  Medications Ordered in ED Medications - No data to display   Initial Impression / Assessment and Plan / ED Course  I have reviewed the triage vital signs and the nursing notes.  Pertinent labs & imaging results that were available during my care of the patient were reviewed by me and  considered in my medical decision making (see chart for details).  Clinical Course as of Jul 08 2306  Mon Jul 08, 2018  2305 Normal  I-Stat beta hCG blood, ED [EW]  2305 Normal  cbc [EW]  2305 Normal  Acetaminophen level(!) [EW]  2305 Normal  Ethanol [EW]  2305 Normal  Salicylate level [EW]  2306 Normal  Comprehensive metabolic panel(!) [EW]    Clinical Course User Index [EW] Mancel BaleWentz, Journee Bobrowski, MD     Patient Vitals for the past 24 hrs:  BP Temp Temp src Pulse Resp SpO2 Height Weight  07/08/18 2140 - - - - - - 5\' 3"  (1.6 m) 113.4 kg  07/08/18 2125 139/82 98.3 F (36.8 C) Oral 88 18 100 % - -   TTS consult  Medical Decision Making: Depression with suicidal ideation.  Patient requires evaluation treatment by psychiatry and may require placement.  CRITICAL CARE-no Performed by: Mancel BaleElliott Galan Ghee   Nursing Notes Reviewed/ Care Coordinated Applicable Imaging Reviewed Interpretation of Laboratory Data incorporated into ED treatment   Plan-as per TTS in conjunction with oncoming provider team    Final Clinical Impressions(s) / ED Diagnoses   Final diagnoses:  Suicidal thoughts  Moderate episode of recurrent major depressive disorder [facility](HCC)    ED Discharge Orders    None       Mancel BaleWentz, Ezrie Bunyan, MD 07/08/18 2307

## 2018-07-09 ENCOUNTER — Encounter (HOSPITAL_COMMUNITY): Payer: Self-pay | Admitting: *Deleted

## 2018-07-09 ENCOUNTER — Inpatient Hospital Stay (HOSPITAL_COMMUNITY)
Admission: AD | Admit: 2018-07-09 | Discharge: 2018-07-14 | DRG: 885 | Disposition: A | Discharge status: Home / Self Care | Payer: Commercial Managed Care - PPO | Source: Intra-hospital | Attending: Psychiatry | Admitting: Psychiatry

## 2018-07-09 ENCOUNTER — Other Ambulatory Visit: Payer: Self-pay

## 2018-07-09 DIAGNOSIS — F410 Panic disorder [episodic paroxysmal anxiety] without agoraphobia: Secondary | ICD-10-CM | POA: Diagnosis present

## 2018-07-09 DIAGNOSIS — F4000 Agoraphobia, unspecified: Secondary | ICD-10-CM | POA: Diagnosis present

## 2018-07-09 DIAGNOSIS — I10 Essential (primary) hypertension: Secondary | ICD-10-CM | POA: Diagnosis present

## 2018-07-09 DIAGNOSIS — G4700 Insomnia, unspecified: Secondary | ICD-10-CM | POA: Diagnosis not present

## 2018-07-09 DIAGNOSIS — F419 Anxiety disorder, unspecified: Secondary | ICD-10-CM | POA: Diagnosis not present

## 2018-07-09 DIAGNOSIS — F329 Major depressive disorder, single episode, unspecified: Secondary | ICD-10-CM | POA: Diagnosis not present

## 2018-07-09 DIAGNOSIS — F332 Major depressive disorder, recurrent severe without psychotic features: Secondary | ICD-10-CM | POA: Diagnosis present

## 2018-07-09 DIAGNOSIS — R45851 Suicidal ideations: Secondary | ICD-10-CM | POA: Diagnosis present

## 2018-07-09 LAB — URINALYSIS, ROUTINE W REFLEX MICROSCOPIC
Bilirubin Urine: NEGATIVE
Glucose, UA: NEGATIVE mg/dL
Hgb urine dipstick: NEGATIVE
Ketones, ur: NEGATIVE mg/dL
Leukocytes, UA: NEGATIVE
Nitrite: NEGATIVE
Protein, ur: NEGATIVE mg/dL
Specific Gravity, Urine: 1.01 (ref 1.005–1.030)
pH: 7 (ref 5.0–8.0)

## 2018-07-09 LAB — RAPID URINE DRUG SCREEN, HOSP PERFORMED
Amphetamines: NOT DETECTED
Barbiturates: NOT DETECTED
Benzodiazepines: NOT DETECTED
Cocaine: NOT DETECTED
Opiates: NOT DETECTED
Tetrahydrocannabinol: NOT DETECTED

## 2018-07-09 MED ORDER — MAGNESIUM HYDROXIDE 400 MG/5ML PO SUSP
30.0000 mL | Freq: Every day | ORAL | Status: DC | PRN
  Administered 2018-07-12: 30 mL via ORAL
  Filled 2018-07-09: qty 30

## 2018-07-09 MED ORDER — HYDROXYZINE HCL 25 MG PO TABS
25.0000 mg | ORAL_TABLET | Freq: Three times a day (TID) | ORAL | Status: DC | PRN
  Administered 2018-07-10: 25 mg via ORAL
  Filled 2018-07-09 (×2): qty 1

## 2018-07-09 MED ORDER — TRAZODONE HCL 50 MG PO TABS: 50.0000 mg | ORAL_TABLET | Freq: Every evening | ORAL | Status: DC | PRN

## 2018-07-09 MED ORDER — ACETAMINOPHEN 325 MG PO TABS: 650.0000 mg | ORAL_TABLET | Freq: Four times a day (QID) | ORAL | Status: DC | PRN

## 2018-07-09 MED ORDER — ALUM & MAG HYDROXIDE-SIMETH 200-200-20 MG/5ML PO SUSP: 30.0000 mL | ORAL | Status: DC | PRN

## 2018-07-09 NOTE — Progress Notes (Signed)
Ashley Dawson is a 32 year old female pt admitted on voluntary basis. She reports that she drove herself to the hospital because she has been feeling depressed and suicidal. She does endorse passive SI on admission but is able to contract for safety on the unit. She reports financial issues, taking care of her 3 children and ex not helping her. She reports that she is not on any medications. She denies any substance abuse issues. She reports that she has been hearing voices for years but has never really sought help for it before. She reports that she lives at home with her children and her mother and reports that she will be go back there once she is discharged. Ashley Dawson was escorted to the unit, oriented to the milieu, and safety maintained.

## 2018-07-09 NOTE — ED Notes (Signed)
Accepted patient to the acute psychiatric unit.  Patient presents tearful saying she has been depressed for years.  Last year she reports she was given an antidepressant medication by her family doctor, but has not been taking it.  Patient reports feeling suicidal with a plan to overdose on her antidepressants.  Denies HI and AV hallucinations.

## 2018-07-09 NOTE — Progress Notes (Signed)
Adult Psychoeducational Group Note  Date:  07/09/2018 Time:  9:39 PM  Group Topic/Focus:  Wrap-Up Group:   The focus of this group is to help patients review their daily goal of treatment and discuss progress on daily workbooks.  Participation Level:  Did Not Attend  Participation Quality:  Did not attend  Affect:  Did not attend  Cognitive:  Did not attend  Insight: None  Engagement in Group:  Did not attend  Modes of Intervention:  Did not attend  Additional Comments:  Pt did not attend evening wrap up group tonight.  Felipa FurnaceChristopher  Nakaiya Beddow 07/09/2018, 9:39 PM

## 2018-07-09 NOTE — BH Assessment (Addendum)
[facility] Assessment Progress Note      Per Dr Sharma CovertNorman, patient meets inpatient admission criteria and [facility].

## 2018-07-09 NOTE — ED Notes (Signed)
[facility] called for transport at 6pm.

## 2018-07-09 NOTE — ED Notes (Signed)
[facility] Expected date:  Expected time:  Means of arrival:  Comments: OOS no bed

## 2018-07-09 NOTE — ED Notes (Signed)
[facility] Expected date:  Expected time:  Means of arrival:  Comments: tr5

## 2018-07-09 NOTE — ED Notes (Addendum)
Jason Berry, NP, patient meets inpatient criteria. TTS to secure placement.  

## 2018-07-09 NOTE — Tx Team (Signed)
Initial Treatment Plan 07/09/2018 6:34 PM Ashley Dawson [place] [id number]    PATIENT STRESSORS: Financial difficulties Marital or family conflict   PATIENT STRENGTHS: Ability for insight Average or above average intelligence Capable of independent living General fund of knowledge Physical Health   PATIENT IDENTIFIED PROBLEMS: Depression Suicidal thoughts Auditory hallucinations "Get real depressed, hear voices and have suicidal thoughts" "Want to know how to cope with it"                     DISCHARGE CRITERIA:  Ability to meet basic life and health needs Improved stabilization in mood, thinking, and/or behavior Reduction of life-threatening or endangering symptoms to within safe limits Verbal commitment to aftercare and medication compliance  PRELIMINARY DISCHARGE PLAN: Attend aftercare/continuing care group Return to previous living arrangement  PATIENT/FAMILY INVOLVEMENT: This treatment plan has been presented to and reviewed with the patient, Ashley Dawson Volker, and/or family member, .  The patient and family have been given the opportunity to ask questions and make suggestions.  Mckinsley Koelzer, [place], [place] 07/09/2018, 6:34 PM

## 2018-07-09 NOTE — BH Assessment (Addendum)
Tele Assessment Note   Patient Name: Ashley Dawson MRN: [id number] Referring Physician: Dr. Effie Shy Location of Patient: [facility] Location of Provider: Behavioral Health TTS Department  Ashley Dawson is an 32 y.o. female presenting with SI with plan to drive car off bridge or overdose on pills. Patient reports past 2 weeks increased depression, crying spells and feeling like she is a failure, along with worsening suicidal thoughts. Patient reports worrying about how her children would feel if she killed herself. Patient is a single parent to 3 children (14, 11 and 9). Patient reported getting no help from ex-husband which is one of her triggers. Patient is employed and resides with her 3 children, along with her mother whom is taking care of the kids while she is in the ED. Patient reports onset months to years ago, not currently being addressed. Patient reported taking antidepressants prescribed by her primary care provider until 2 years ago. Patient is not currently seeing a therapist. No prior inpatient treatment reported. No prior self-harm or attempts. Patient reported hearing her voice stating, "Im nothing, Im a failure, take pills". Patient reported 5-6 hours interrupted sleep, along with loss of appetite, patient eats one meal daily. ETOH negative and UDS still needed.   Patient cooperative during assessment. Patient alert and oriented x4. Patient mood was depressed, helpless, sad, worthless, low self-esteem and despair. Patient affect was depressed and sad. Thought processes was coherent.   Disposition: Ashley Conn, NP, patient meets inpatient criteria. TTS to secure placement. Doctor and RN informed of disposition.   Diagnosis: Major Depressive Disorder  Past Medical History:  Past Medical History:  Diagnosis Date  . Depression     History reviewed. No pertinent surgical history.  Family History: History reviewed. No pertinent family history.  Social History:  reports that  she has never smoked. She has never used smokeless tobacco. She reports that she does not drink alcohol. Her drug history is not on file.  Additional Social History:  Alcohol / Drug Use Pain Medications: see MAR Prescriptions: see MAR Over the Counter: see MAR  CIWA: CIWA-Ar BP: 139/82 Pulse Rate: 88 COWS:    Allergies: No Known Allergies  Home Medications:  (Not in a hospital admission)  OB/GYN Status:  Patient's last menstrual period was 06/07/2018.  General Assessment Data Location of Assessment: [facility] ED TTS Assessment: In system Is this a Tele or Face-to-Face Assessment?: Face-to-Face Is this an Initial Assessment or a Re-assessment for this encounter?: Initial Assessment Patient Accompanied by:: (no one) Language Other than English: No Living Arrangements: (family home) What gender do you identify as?: Female Marital status: Divorced Pregnancy Status: Unknown Living Arrangements: Children(mother and her 3 children) Can pt return to current living arrangement?: Yes Admission Status: Voluntary Is patient capable of signing voluntary admission?: Yes Referral Source: Self/Family/Friend     Crisis Care Plan Living Arrangements: Children(mother and her 3 children) Legal Guardian: (self) Name of Psychiatrist: (none) Name of Therapist: (none)  Education Status Is patient currently in school?: No Is the patient employed, unemployed or receiving disability?: Employed  Risk to self with the past 6 months Suicidal Ideation: Yes-Currently Present Has patient been a risk to self within the past 6 months prior to admission? : No Suicidal Intent: No Has patient had any suicidal intent within the past 6 months prior to admission? : No Is patient at risk for suicide?: Yes Suicidal Plan?: Yes-Currently Present Has patient had any suicidal plan within the past 6 months prior to admission? : No  Specify Current Suicidal Plan: (driving car off bridge or overdosing on  pills) Access to Means: Yes Specify Access to Suicidal Means: (owns car and has pills in the home) What has been your use of drugs/alcohol within the last 12 months?: (none) Previous Attempts/Gestures: No How many times?: (n/a) Other Self Harm Risks: (n/a) Triggers for Past Attempts: (n/a) Intentional Self Injurious Behavior: None Family Suicide History: No Recent stressful life event(s): (overwhelmed parent of 3 kids w/ no ex-husband support) Persecutory voices/beliefs?: No Depression: Yes Depression Symptoms: Insomnia, Feeling worthless/self pity, Tearfulness, Fatigue, Guilt Substance abuse history and/or treatment for substance abuse?: No  Risk to Others within the past 6 months Homicidal Ideation: No Does patient have any lifetime risk of violence toward others beyond the six months prior to admission? : No Thoughts of Harm to Others: No Current Homicidal Intent: No Current Homicidal Plan: No Access to Homicidal Means: No Identified Victim: (n/a) History of harm to others?: No Assessment of Violence: None Noted Violent Behavior Description: (none) Does patient have access to weapons?: No Criminal Charges Pending?: No Does patient have a court date: No Is patient on probation?: No  Psychosis Hallucinations: Auditory("Im nothing, Im a failure, take pills") Delusions: None noted  Mental Status Report Appearance/Hygiene: Unremarkable Eye Contact: Fair Motor Activity: Unremarkable Speech: Logical/coherent, Soft Level of Consciousness: Alert Mood: Depressed, Helpless, Sad, Worthless, low self-esteem, Despair Affect: Depressed, Sad Anxiety Level: Minimal Thought Processes: Coherent Judgement: Impaired Orientation: Person, Place, Time, Situation Obsessive Compulsive Thoughts/Behaviors: None  Cognitive Functioning Concentration: Fair Memory: Recent Intact, Remote Intact Is patient IDD: No Insight: Fair Impulse Control: Fair Appetite: Poor(1 meal daily) Have you had  any weight changes? : No Change Sleep: Decreased Total Hours of Sleep: (5-6 hours of interupted sleep) Vegetative Symptoms: None  ADLScreening [facility] Assessment Services) Patient's cognitive ability adequate to safely complete daily activities?: Yes Patient able to express need for assistance with ADLs?: Yes Independently performs ADLs?: Yes (appropriate for developmental age)  Prior Inpatient Therapy Prior Inpatient Therapy: No  Prior Outpatient Therapy Prior Outpatient Therapy: No Does patient have an ACCT team?: No Does patient have Intensive In-House Services?  : No Does patient have [facility] services? : No Does patient have P4CC services?: No  ADL Screening (condition at time of admission) Patient's cognitive ability adequate to safely complete daily activities?: Yes Patient able to express need for assistance with ADLs?: Yes Independently performs ADLs?: Yes (appropriate for developmental age)             Merchant navy officerAdvance Directives (For Healthcare) Does Patient Have a Medical Advance Directive?: No          Disposition:  Disposition Initial Assessment Completed for this Encounter: Yes Disposition of Patient: Admit Type of inpatient treatment program: Adult Patient refused recommended treatment: No  Ashley ConnJason Berry, NP, patient meets inpatient criteria. TTS to secure placement. Doctor and RN informed of disposition.  This service was provided via telemedicine using a 2-way, interactive audio and video technology.  Names of all persons participating in this telemedicine service and their role in this encounter. Name: Ashley LowersChristina Willner Role: patient  Name: Ashley Dawson, [facility] Role: TTS Clinician  Name:  Role:   Name:  Role:     Ashley Dawson 07/09/2018 2:55 AM

## 2018-07-09 NOTE — ED Notes (Signed)
Report called [facility]manda [facility].

## 2018-07-10 ENCOUNTER — Other Ambulatory Visit: Payer: Self-pay

## 2018-07-10 DIAGNOSIS — F332 Major depressive disorder, recurrent severe without psychotic features: Principal | ICD-10-CM

## 2018-07-10 LAB — LIPID PANEL
Cholesterol: 195 mg/dL (ref 0–200)
HDL: 59 mg/dL (ref >40)
LDL Cholesterol: 121 mg/dL — ABNORMAL HIGH (ref 0–99)
Total CHOL/HDL Ratio: 3.3 RATIO
Triglycerides: 75 mg/dL (ref <150)
VLDL: 15 mg/dL (ref 0–40)

## 2018-07-10 LAB — TSH: TSH: 1.891 u[IU]/mL (ref 0.350–4.500)

## 2018-07-10 LAB — RAPID URINE DRUG SCREEN, HOSP PERFORMED
Amphetamines: NOT DETECTED
Barbiturates: NOT DETECTED
Benzodiazepines: NOT DETECTED
Cocaine: NOT DETECTED
Opiates: NOT DETECTED
Tetrahydrocannabinol: NOT DETECTED

## 2018-07-10 LAB — URINALYSIS, ROUTINE W REFLEX MICROSCOPIC
Bilirubin Urine: NEGATIVE
Glucose, UA: NEGATIVE mg/dL
Hgb urine dipstick: NEGATIVE
Ketones, ur: 5 mg/dL — AB
Leukocytes, UA: NEGATIVE
Nitrite: NEGATIVE
Protein, ur: NEGATIVE mg/dL
Specific Gravity, Urine: 1.013 (ref 1.005–1.030)
pH: 6 (ref 5.0–8.0)

## 2018-07-10 LAB — GLUCOSE, CAPILLARY: Glucose-Capillary: 92 mg/dL (ref 70–99)

## 2018-07-10 MED ORDER — VENLAFAXINE HCL ER 37.5 MG PO CP24
37.5000 mg | ORAL_CAPSULE | Freq: Every day | ORAL | Status: DC
  Administered 2018-07-10 – 2018-07-11 (×2): 37.5 mg via ORAL
  Filled 2018-07-10 (×5): qty 1

## 2018-07-10 MED ORDER — ARIPIPRAZOLE 5 MG PO TABS
5.0000 mg | ORAL_TABLET | Freq: Every day | ORAL | Status: DC
  Administered 2018-07-10 – 2018-07-14 (×5): 5 mg via ORAL
  Filled 2018-07-10 (×8): qty 1

## 2018-07-10 MED ORDER — ONDANSETRON 4 MG PO TBDP
4.0000 mg | ORAL_TABLET | Freq: Four times a day (QID) | ORAL | Status: DC | PRN
  Filled 2018-07-10: qty 1
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
## 2018-07-10 NOTE — Plan of Care (Addendum)
Problem: Safety: Goal: Periods of time without injury will increase Outcome: Progressing   Problem: Education: Goal: Utilization of techniques to improve thought processes will improve Outcome: Progressing   Problem: Medication: Goal: Compliance with prescribed medication regimen will improve Outcome: Progressing D: Pt awake in bed at this time. Had a syncope episode at medication window this evening related to orthostatic hypotension "I'm still feeling dizzy" and lowered herself to the floor. Per pt "I had this last summer". Denies SI, HI, AVH and pain when assessed. Visible in dayroom for groups and was engaged. Rates her depression, hopelessness and anxiety all 0/10 on self inventory sheet. Pt's goal for today "finding ways to cope with my depression and go home". Pt was started on Effexor and Abilify this shift. Compliant with medication when offered. A: Vitals, CBG and EKG done and recorded. All medications given as per order with verbal educations and effects monitored. Verbal education done on position changes related to orthostatic hypotension. Fluids given. Encouraged pt to voice concerns. Safety checks continues without self harm gestures or outburst thus far.  R: Pt receptive to care. Verbalized understanding related to teaching on hypotension. Tolerates fluids and procedures well. Remains safe on unit.
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
## 2018-07-10 NOTE — Tx Team (Signed)
Interdisciplinary Treatment and Diagnostic Plan Update  07/10/2018 Time of Session: 9:45am Ashley Dawson MRN: [id number]  Principal Diagnosis: <principal problem not specified>  Secondary Diagnoses: Active Problems:   MDD (major depressive disorder), recurrent severe, without psychosis ([place])   Current Medications:  Current Facility-Administered Medications  Medication Dose Route Frequency Provider Last Rate Last Dose  . acetaminophen (TYLENOL) tablet 650 mg  650 mg Oral Q6H PRN Nanci Pina, FNP      . alum & mag hydroxide-simeth (MAALOX/MYLANTA) 200-200-20 MG/5ML suspension 30 mL  30 mL Oral Q4H PRN Nanci Pina, FNP      . ARIPiprazole (ABILIFY) tablet 5 mg  5 mg Oral Daily Cobos, Fernando A, MD   5 mg at 07/10/18 1312  . hydrOXYzine (ATARAX/VISTARIL) tablet 25 mg  25 mg Oral TID PRN Nanci Pina, FNP      . magnesium hydroxide (MILK OF MAGNESIA) suspension 30 mL  30 mL Oral Daily PRN Starkes, Takia S, FNP      . traZODone (DESYREL) tablet 50 mg  50 mg Oral QHS PRN Nanci Pina, FNP      . venlafaxine XR (EFFEXOR-XR) 24 hr capsule 37.5 mg  37.5 mg Oral Q breakfast Cobos, Myer Peer, MD   37.5 mg at 07/10/18 1312   PTA Medications: No medications prior to admission.    Patient Stressors: Financial difficulties Marital or family conflict  Patient Strengths: Ability for insight Average or above average intelligence Capable of independent living General fund of knowledge Physical Health  Treatment Modalities: Medication Management, Group therapy, Case management,  1 to 1 session with clinician, Psychoeducation, Recreational therapy.   Physician Treatment Plan for Primary Diagnosis: <principal problem not specified> Long Term Goal(s): Improvement in symptoms so as ready for discharge Improvement in symptoms so as ready for discharge   Short Term Goals: Ability to identify changes in lifestyle to reduce recurrence of condition will improve Ability to  maintain clinical measurements within normal limits will improve Ability to identify changes in lifestyle to reduce recurrence of condition will improve Ability to verbalize feelings will improve Ability to disclose and discuss suicidal ideas Ability to demonstrate self-control will improve Ability to identify and develop effective coping behaviors will improve Ability to maintain clinical measurements within normal limits will improve  Medication Management: Evaluate patient's response, side effects, and tolerance of medication regimen.  Therapeutic Interventions: 1 to 1 sessions, Unit Group sessions and Medication administration.  Evaluation of Outcomes: Not Met  Physician Treatment Plan for Secondary Diagnosis: Active Problems:   MDD (major depressive disorder), recurrent severe, without psychosis ([place])  Long Term Goal(s): Improvement in symptoms so as ready for discharge Improvement in symptoms so as ready for discharge   Short Term Goals: Ability to identify changes in lifestyle to reduce recurrence of condition will improve Ability to maintain clinical measurements within normal limits will improve Ability to identify changes in lifestyle to reduce recurrence of condition will improve Ability to verbalize feelings will improve Ability to disclose and discuss suicidal ideas Ability to demonstrate self-control will improve Ability to identify and develop effective coping behaviors will improve Ability to maintain clinical measurements within normal limits will improve     Medication Management: Evaluate patient's response, side effects, and tolerance of medication regimen.  Therapeutic Interventions: 1 to 1 sessions, Unit Group sessions and Medication administration.  Evaluation of Outcomes: Not Met   RN Treatment Plan for Primary Diagnosis: <principal problem not specified> Long Term Goal(s): Knowledge of disease and therapeutic  regimen to maintain health will  improve  Short Term Goals: Ability to participate in decision making will improve, Ability to disclose and discuss suicidal ideas, Ability to identify and develop effective coping behaviors will improve and Compliance with prescribed medications will improve  Medication Management: RN will administer medications as ordered by provider, will assess and evaluate patient's response and provide education to patient for prescribed medication. RN will report any adverse and/or side effects to prescribing provider.  Therapeutic Interventions: 1 on 1 counseling sessions, Psychoeducation, Medication administration, Evaluate responses to treatment, Monitor vital signs and CBGs as ordered, Perform/monitor CIWA, COWS, AIMS and Fall Risk screenings as ordered, Perform wound care treatments as ordered.  Evaluation of Outcomes: Not Met   LCSW Treatment Plan for Primary Diagnosis: <principal problem not specified> Long Term Goal(s): Safe transition to appropriate next level of care at discharge, Engage patient in therapeutic group addressing interpersonal concerns.  Short Term Goals: Engage patient in aftercare planning with referrals and resources  Therapeutic Interventions: Assess for all discharge needs, 1 to 1 time with Social worker, Explore available resources and support systems, Assess for adequacy in community support network, Educate family and significant other(s) on suicide prevention, Complete Psychosocial Assessment, Interpersonal group therapy.  Evaluation of Outcomes: Not Met   Progress in Treatment: Attending groups: No. New to unit  Participating in groups: No. Taking medication as prescribed: Yes. Toleration medication: Yes. Family/Significant other contact made: No, will contact:  the patient's mother Patient understands diagnosis: Yes. Discussing patient identified problems/goals with staff: Yes. Medical problems stabilized or resolved: Yes. Denies suicidal/homicidal ideation:  Yes. Issues/concerns per patient self-inventory: No. Other:   New problem(s) identified: None  New Short Term/Long Term Goal(s):medication stabilization, elimination of SI thoughts, development of comprehensive mental wellness plan.   Patient Goals:  Want to know how to cope  Discharge Plan or Barriers: Patient plans to discharge home and follow up with [facility] for outpatient medication management and therapy services.   Reason for Continuation of Hospitalization: Depression Hallucinations Medication stabilization Suicidal ideation  Estimated Length of Stay: 3-5 days   Attendees: Patient: 07/10/2018 1:50 PM  Physician: Dr. Neita Garnet, MD 07/10/2018 1:50 PM  Nursing: Nicoletta Dress RN 07/10/2018 1:50 PM  RN Care Manager: Rhunette Croft 07/10/2018 1:50 PM  Social Worker: Radonna Ricker, [place] 07/10/2018 1:50 PM  Recreational Therapist: X 07/10/2018 1:50 PM  Other: X 07/10/2018 1:50 PM  Other: X 07/10/2018 1:50 PM  Other:X 07/10/2018 1:50 PM    Scribe for Treatment Team: Marylee Floras, [place] 07/10/2018 1:50 PM

## 2018-07-10 NOTE — [facility] Group Notes (Signed)
[facility] [employer] Group Therapy      07/10/2018 2:47 PM  Type of Therapy: Mental Health Association Presentation  Participation Level: Active  Participation Quality: Attentive  Affect: Appropriate  Cognitive: Oriented  Insight: Developing/Improving  Engagement in Therapy: Engaged  Modes of Intervention: Discussion, Education and Socialization  Summary of Progress/Problems: Mental Health Association (MHA) Speaker came to talk about his personal journey with mental health. The pt processed ways by which to relate to the speaker. MHA speaker provided handouts and educational information pertaining to groups and services offered by the MHA. Pt was engaged in speaker's presentation and was receptive to resources provided.    Issacc Merlo LCSWA Clinical Social Worker   

## 2018-07-10 NOTE — [facility] Group Notes (Signed)
[facility] Group Notes:  (Nursing/Personal developement)  Date:  07/10/2018  Time: 1330 Type of Therapy:  Nurse Education  Participation Level:  Active  Participation Quality:  Attentive, Sharing and Supportive  Affect:  Flat, depressed  Cognitive:  Alert, Appropriate and Oriented  Insight:  Limited  Engagement in Group:  Engaged  Modes of Intervention:  Discussion, Education and Support  Summary of Progress/Problems: Pt attended group and was engaged.  Sherryl MangesWesseh, Nyheem Binette 07/10/2018, 7:17 PM

## 2018-07-10 NOTE — Progress Notes (Signed)
Recreation Therapy Notes  Date: 9.18.19 Time: 0930 Location: [address] Dayroom  Group Topic: Stress Management  Goal Area(s) Addresses:  Patient will verbalize importance of using healthy stress management.  Patient will identify positive emotions associated with healthy stress management.   Intervention: Stress Management  Activity :  Meditation.  LRT introduced the stress management technique of meditation to patients.  LRT played a meditation on choice for patients.  Patients were to follow along as meditation played to engage in the activity.  Education:  Stress Management, Discharge Planning.   Education Outcome: Acknowledges edcuation/In group clarification offered/Needs additional education  Clinical Observations/Feedback: Pt did not attend group.    Neo Yepiz, LRT/CTRS         Emmalyne Giacomo A 07/10/2018 10:56 AM 

## 2018-07-11 DIAGNOSIS — G4700 Insomnia, unspecified: Secondary | ICD-10-CM

## 2018-07-11 DIAGNOSIS — F419 Anxiety disorder, unspecified: Secondary | ICD-10-CM

## 2018-07-11 LAB — HEMOGLOBIN A1C
Hgb A1c MFr Bld: 5.5 % (ref 4.8–5.6)
Mean Plasma Glucose: 111 mg/dL

## 2018-07-11 MED ORDER — VENLAFAXINE HCL ER 75 MG PO CP24
75.0000 mg | ORAL_CAPSULE | Freq: Every day | ORAL | Status: DC
  Administered 2018-07-12 – 2018-07-14 (×3): 75 mg via ORAL
  Filled 2018-07-11 (×6): qty 1

## 2018-07-11 NOTE — Progress Notes (Signed)
[facility] Group Notes:  (Nursing/MHT/Case Management/Adjunct)  Date:  07/11/2018  Time: 4:00 PM  Type of Therapy:  Nurse Education  Participation Level:  Active  Participation Quality:  Appropriate and Supportive  Affect:  Appropriate  Cognitive:  Alert and Appropriate  Insight:  Appropriate  Engagement in Group:  Developing/Improving and Engaged  Modes of Intervention:  Discussion and Education  Summary of Progress/Problems: Patient is progressing in plan of treatment.  Ashley Dawson 07/11/2018, 5:29 PM 

## 2018-07-11 NOTE — Progress Notes (Signed)
[facility] MD Progress Note  07/11/2018 6:09 PM Ashley Dawson  MRN:  [id number] Subjective: Patient reports she is feeling better today, describes improving mood, denies suicidal ideations at present.  She denies medication side effects.  Yesterday evening patient had episode of dizziness/lightheadedness.  She states the symptoms are now improved/resolved and attributes symptoms to "I think I was dehydrated".  Objective: I have discussed case with treatment team and have met with patient . 32 year old female, presented due to worsening depression, neurovegetative symptoms of depression, auditory hallucinations, suicidal ideations of overdosing. Today presents with improving mood and range of affect, reports she feels better than she did prior to admission. Visible on unit, going to groups. Today denies suicidal ideations. Denies current medication side effects-currently on Effexor XR/Abilify.  Principal Problem:MDD Diagnosis:   Patient Active Problem List   Diagnosis Date Noted  . MDD (major depressive disorder), recurrent severe, without psychosis ([place]) [F33.2] 07/09/2018   Total Time spent with patient: 15 minutes   Past Psychiatric History:   Past Medical History:  Past Medical History:  Diagnosis Date  . Depression    History reviewed. No pertinent surgical history. Family History: History reviewed. No pertinent family history. Family Psychiatric  History: Social History:  Social History   Substance and Sexual Activity  Alcohol Use No     Social History   Substance and Sexual Activity  Drug Use Not Currently    Social History   Socioeconomic History  . Marital status: Divorced    Spouse name: Not on file  . Number of children: Not on file  . Years of education: Not on file  . Highest education level: Not on file  Occupational History  . Not on file  Social Needs  . Financial resource strain: Not on file  . Food insecurity:    Worry: Not on file    Inability: Not  on file  . Transportation needs:    Medical: Not on file    Non-medical: Not on file  Tobacco Use  . Smoking status: Never Smoker  . Smokeless tobacco: Never Used  Substance and Sexual Activity  . Alcohol use: No  . Drug use: Not Currently  . Sexual activity: Not Currently  Lifestyle  . Physical activity:    Days per week: Not on file    Minutes per session: Not on file  . Stress: Not on file  Relationships  . Social connections:    Talks on phone: Not on file    Gets together: Not on file    Attends religious service: Not on file    Active member of club or organization: Not on file    Attends meetings of clubs or organizations: Not on file    Relationship status: Not on file  Other Topics Concern  . Not on file  Social History Narrative  . Not on file   Additional Social History:   Sleep: improving   Appetite:  improving   Current Medications: Current Facility-Administered Medications  Medication Dose Route Frequency Provider Last Rate Last Dose  . acetaminophen (TYLENOL) tablet 650 mg  650 mg Oral Q6H PRN Nanci Pina, FNP      . alum & mag hydroxide-simeth (MAALOX/MYLANTA) 200-200-20 MG/5ML suspension 30 mL  30 mL Oral Q4H PRN Nanci Pina, FNP      . ARIPiprazole (ABILIFY) tablet 5 mg  5 mg Oral Daily Khaliq Turay, Myer Peer, MD   5 mg at 07/11/18 0826  . hydrOXYzine (ATARAX/VISTARIL) tablet 25 mg  25 mg Oral TID PRN Nanci Pina, FNP   25 mg at 07/10/18 2219  . magnesium hydroxide (MILK OF MAGNESIA) suspension 30 mL  30 mL Oral Daily PRN Lavina Hamman, Takia S, FNP      . ondansetron (ZOFRAN-ODT) disintegrating tablet 4 mg  4 mg Oral QID PRN Okema Rollinson, Myer Peer, MD      . traZODone (DESYREL) tablet 50 mg  50 mg Oral QHS PRN Nanci Pina, FNP      . venlafaxine XR (EFFEXOR-XR) 24 hr capsule 37.5 mg  37.5 mg Oral Q breakfast Harsh Trulock, Myer Peer, MD   37.5 mg at 07/11/18 6270    Lab Results:  Results for orders placed or performed during the hospital encounter of  07/09/18 (from the past 48 hour(s))  Rapid urine drug screen (hospital performed)     Status: None   Collection Time: 07/10/18  6:17 AM  Result Value Ref Range   Opiates NONE DETECTED NONE DETECTED   Cocaine NONE DETECTED NONE DETECTED   Benzodiazepines NONE DETECTED NONE DETECTED   Amphetamines NONE DETECTED NONE DETECTED   Tetrahydrocannabinol NONE DETECTED NONE DETECTED   Barbiturates NONE DETECTED NONE DETECTED    Comment: (NOTE) DRUG SCREEN FOR MEDICAL PURPOSES ONLY.  IF CONFIRMATION IS NEEDED FOR ANY PURPOSE, NOTIFY LAB WITHIN 5 DAYS. LOWEST DETECTABLE LIMITS FOR URINE DRUG SCREEN Drug Class                     Cutoff (ng/mL) Amphetamine and metabolites    1000 Barbiturate and metabolites    200 Benzodiazepine                 350 Tricyclics and metabolites     300 Opiates and metabolites        300 Cocaine and metabolites        300 THC                            50 Performed at [facility], [place] [address] [place], [place] [address]   Urinalysis, Routine w reflex microscopic     Status: Abnormal   Collection Time: 07/10/18  6:17 AM  Result Value Ref Range   Color, Urine YELLOW YELLOW   APPearance CLEAR CLEAR   Specific Gravity, Urine 1.013 1.005 - 1.030   pH 6.0 5.0 - 8.0   Glucose, UA NEGATIVE NEGATIVE mg/dL   Hgb urine dipstick NEGATIVE NEGATIVE   Bilirubin Urine NEGATIVE NEGATIVE   Ketones, ur 5 (A) NEGATIVE mg/dL   Protein, ur NEGATIVE NEGATIVE mg/dL   Nitrite NEGATIVE NEGATIVE   Leukocytes, UA NEGATIVE NEGATIVE    Comment: Performed at [facility] [address] [place], [place] [address]  Hemoglobin A1c     Status: None   Collection Time: 07/10/18  6:33 AM  Result Value Ref Range   Hgb A1c MFr Bld 5.5 4.8 - 5.6 %    Comment: (NOTE)         Prediabetes: 5.7 - 6.4         Diabetes: >6.4         Glycemic control for adults with diabetes: <7.0    Mean Plasma Glucose 111 mg/dL    Comment: (NOTE) Performed At: [facility] [place], [place] [id number] Rush Farmer MD [id number]   Lipid panel     Status: Abnormal   Collection Time: 07/10/18  6:33 AM  Result Value Ref Range   Cholesterol 195 0 - 200 mg/dL   Triglycerides 75 <150 mg/dL   HDL 59 >40 mg/dL   Total CHOL/HDL Ratio 3.3 RATIO   VLDL 15 0 - 40 mg/dL   LDL Cholesterol 121 (H) 0 - 99 mg/dL    Comment:        Total Cholesterol/HDL:CHD Risk Coronary Heart Disease Risk Table                     Men   Women  1/2 Average Risk   3.4   3.3  Average Risk       5.0   4.4  2 X Average Risk   9.6   7.1  3 X Average Risk  23.4   11.0        Use the calculated Patient Ratio above and the CHD Risk Table to determine the patient's CHD Risk.        ATP III CLASSIFICATION (LDL):  <100     mg/dL   Optimal  100-129  mg/dL   Near or Above                    Optimal  130-159  mg/dL   Borderline  160-189  mg/dL   High  >190     mg/dL   Very High Performed at [facility] [address], [place], [place] [address]   TSH     Status: None   Collection Time: 07/10/18  6:33 AM  Result Value Ref Range   TSH 1.891 0.350 - 4.500 uIU/mL    Comment: Performed by a 3rd Generation assay with a functional sensitivity of <=0.01 uIU/mL. Performed at [facility], [place] [address] [place], [place] [address]   Glucose, capillary     Status: None   Collection Time: 07/10/18  5:53 PM  Result Value Ref Range   Glucose-Capillary 92 70 - 99 mg/dL    Blood Alcohol level:  Lab Results  Component Value Date   ETH <10 32/20/2542    Metabolic Disorder Labs: Lab Results  Component Value Date   HGBA1C 5.5 07/10/2018   MPG 111 07/10/2018   No results found for: PROLACTIN Lab Results  Component Value Date   CHOL 195 07/10/2018   TRIG 75 07/10/2018   HDL 59 07/10/2018   CHOLHDL 3.3 07/10/2018   VLDL 15 07/10/2018   LDLCALC 121 (H) 07/10/2018    Physical Findings: AIMS: Facial and Oral  Movements Muscles of Facial Expression: None, normal Lips and Perioral Area: None, normal Jaw: None, normal Tongue: None, normal,Extremity Movements Upper (arms, wrists, hands, fingers): None, normal Lower (legs, knees, ankles, toes): None, normal, Trunk Movements Neck, shoulders, hips: None, normal, Overall Severity Severity of abnormal movements (highest score from questions above): None, normal Incapacitation due to abnormal movements: None, normal Patient's awareness of abnormal movements (rate only patient's report): No Awareness, Dental Status Current problems with teeth and/or dentures?: No Does patient usually wear dentures?: No  CIWA:    COWS:     Musculoskeletal: Strength & Muscle Tone: within normal limits Gait & Station: normal Patient leans: N/A  Psychiatric Specialty Exam: Physical Exam  ROS-denies chest pain, no shortness of breath, no vomiting. Today no lightheadedness or dizziness reported  Blood pressure 128/79, pulse 66, temperature 98.3 F (36.8 C), temperature source Oral, resp. rate 20, height 5' 4"  (1.626 m), weight 117 kg, SpO2 100 %.Body mass index is 44.29 kg/m.  General Appearance: Improving grooming  Eye Contact:  Good  Speech:  Normal Rate  Volume:  Normal  Mood:  Improving mood, states she feels significantly better than she did prior to admission  Affect:  More reactive, smiles at times appropriately  Thought Process:  Linear and Descriptions of Associations: Intact  Orientation:  Full (Time, Place, and Person)  Thought Content:  No hallucinations, no delusions  Suicidal Thoughts:  No-today denies any suicidal or self-injurious ideations, at this time contracts for safety on unit  Homicidal Thoughts:  No  Memory:  Recent and remote grossly intact  Judgement:  Other:  Improving  Insight:  Improving  Psychomotor Activity:  Normal  Concentration:  Concentration: Good and Attention Span: Good  Recall:  Good  Fund of Knowledge:  Good  Language:   Good  Akathisia:  Negative  Handed:  Right  AIMS (if indicated):     Assets:  Desire for Improvement Resilience  ADL's:  Intact  Cognition:  WNL  Sleep:  Number of Hours: 6.75   Assessment -32 year old female, presented due to worsening depression, neurovegetative symptoms of depression, auditory hallucinations, suicidal ideations of overdosing. Today patient reports improving mood and presents with a fuller range of affect.  At this time denies suicidal ideations.  She does not endorse current hallucinations and does not appear internally preoccupied.  As she improves she is becoming more future oriented and is hopeful for discharge soon.  She had an episode of dizziness/lightheadedness yesterday evening which has resolved-vitals are stable. She is tolerating Effexor XR/Abilify trial well thus far  Treatment Plan Summary: Daily contact with patient to assess and evaluate symptoms and progress in treatment, Medication management, Plan Inpatient treatment and Medications as below Encourage group and milieu participation to work on coping skills and symptom reduction Increase Effexor XR to 75 mg daily for depression Continue Abilify 5 mg daily for mood disorder/psychotic symptoms Continue Trazodone 50 mg nightly PRN for insomnia as needed Continue Vistaril 25 mg every 8 hours PRN for anxiety as needed Treatment team working on disposition [place], MD 07/11/2018, 6:09 PM

## 2018-07-11 NOTE — Progress Notes (Signed)
Patient ID: Ashley Dawson, female   DOB: 01-Nov-1985, 32 y.o.   MRN: [id number]  Nursing Progress Note [id number]  Data: Patient presents pleasant and cooperative this morning. Patient complaint with scheduled medications. Patient denies pain/physical complaints. Patient completed self-inventory sheet and rates depression, hopelessness, and anxiety 0,0,0 respectively. Patient rates their sleep and appetite as good/good respectively. Patient states goal for today is to "work on my discharge plan".  Patient is seen attending groups and visible in the milieu. Patient currently denies SI/HI/AVH. Patient reports she is nervous to take her medications today but feels much better today. Patient denies dizziness and is steady on her feet. PO fluids encouraged. Patient drinking pitcher of ice water.  Action: Patient is educated about and provided medication per provider's orders. Patient safety maintained with q15 min safety checks and frequent rounding. High fall risk precautions in place. Emotional support given. 1:1 interaction and active listening provided. Patient encouraged to attend meals, groups, and work on treatment plan and goals. Labs, vital signs and patient behavior monitored throughout shift.   Response: Patient remains safe on the unit at this time and agrees to come to staff with any issues/concerns. Patient is interacting with peers appropriately on the unit. Will continue to support and monitor.

## 2018-07-11 NOTE — Progress Notes (Signed)
Nutrition Education Note  Pt attended group focusing on general, healthful nutrition education.  RD emphasized the importance of eating regular meals and snacks throughout the day. Consuming sugar-free beverages and incorporating fruits and vegetables into diet when possible. Provided examples of healthy snacks. Patient encouraged to leave group with a goal to improve nutrition/healthy eating. Patient encouraged to exercise and/or spend time outside daily.   Diet Order:  Diet Order            Diet regular Room service appropriate? Yes; Fluid consistency: Thin  Diet effective now             Pt is also offered choice of unit snacks mid-morning and mid-afternoon.  Pt is eating as desired.   If additional nutrition issues arise, please consult RD.    Raeanne Deschler, MS, RD, LDN, CNSC Inpatient Clinical Dietitian Pager # [phone] After hours/weekend pager # [phone]    

## 2018-07-11 NOTE — [facility] Suicide Risk Assessment (Signed)
[facility] INPATIENT:  Family/Significant Other Suicide Prevention Education  Suicide Prevention Education:  Contact Attempts: Kevin FentonBonnie Smoot, mother [phone] has been identified by the patient as the family member/significant other with whom the patient will be residing, and identified as the person(s) who will aid the patient in the event of a mental health crisis.  With written consent from the patient, two attempts were made to provide suicide prevention education, prior to and/or following the patient's discharge.  We were unsuccessful in providing suicide prevention education.  A suicide education pamphlet was given to the patient to share with family/significant other.  Date and time of first attempt:  07/11/2018 / 2:30pm   Ashley Dawson E [name] 07/11/2018, 2:31 PM

## 2018-07-11 NOTE — [facility] Group Notes (Signed)
[facility] LCSW Group Therapy Note  Date/Time: 07/11/18, 1315  Type of Therapy/Topic:  Group Therapy:  Balance in Life  Participation Level:  moderate  Description of Group:    This group will address the concept of balance and how it feels and looks when one is unbalanced. Patients will be encouraged to process areas in their lives that are out of balance, and identify reasons for remaining unbalanced. Facilitators will guide patients utilizing problem- solving interventions to address and correct the stressor making their life unbalanced. Understanding and applying boundaries will be explored and addressed for obtaining  and maintaining a balanced life. Patients will be encouraged to explore ways to assertively make their unbalanced needs known to significant others in their lives, using other group members and facilitator for support and feedback.  Therapeutic Goals: 1. Patient will identify two or more emotions or situations they have that consume much of in their lives. 2. Patient will identify signs/triggers that life has become out of balance:  3. Patient will identify two ways to set boundaries in order to achieve balance in their lives:  4. Patient will demonstrate ability to communicate their needs through discussion and/or role plays  Summary of Patient Progress:Pt identified family and work as areas that are out of balance in her life.  Pt made several comments when asked questions by CSW during group discussion about how to set boundaries and otherwise respond to areas that have become out of balance, but was mostly quiet.            Therapeutic Modalities:   Cognitive Behavioral Therapy Solution-Focused Therapy Assertiveness Training  Daleen SquibbGreg Caresse Sedivy, [place]

## 2018-07-11 NOTE — [facility] Suicide Risk Assessment (Addendum)
[facility] INPATIENT:  Family/Significant Other Suicide Prevention Education  Suicide Prevention Education:  Education Completed; Kevin FentonBonnie Smoot, mother [phone])has been identified by the patient as the family member/significant other with whom the patient will be residing, and identified as the person(s) who will aid the patient in the event of a mental health crisis (suicidal ideations/suicide attempt).  With written consent from the patient, the family member/significant other has been provided the following suicide prevention education, prior to the and/or following the discharge of the patient.  The suicide prevention education provided includes the following:  Suicide risk factors  Suicide prevention and interventions  National Suicide Hotline telephone number  [facility] assessment telephone number  [facility] Assistance 911  [facility] and/or Residential Mobile Crisis Unit telephone number  Request made of family/significant other to:  Remove weapons (e.g., guns, rifles, knives), all items previously/currently identified as safety concern.    Remove drugs/medications (over-the-counter, prescriptions, illicit drugs), all items previously/currently identified as a safety concern.  The family member/significant other verbalizes understanding of the suicide prevention education information provided.  The family member/significant other agrees to remove the items of safety concern listed above.  Patient's mother reports that patient seems to be improving. She states that she does not have any concerns regarding the patient's safety or after care planning at discharge.   Maeola SarahJolan E Shaheed Schmuck 07/11/2018, 3:33 PM

## 2018-07-11 NOTE — Progress Notes (Signed)
Pt did not attend wrap-up group   

## 2018-07-12 MED ORDER — ARIPIPRAZOLE 5 MG PO TABS
5.0000 mg | ORAL_TABLET | Freq: Every day | ORAL | Status: DC
  Filled 2018-07-12: qty 7

## 2018-07-12 MED ORDER — TRAZODONE HCL 50 MG PO TABS
50.0000 mg | ORAL_TABLET | Freq: Every evening | ORAL | Status: DC | PRN
  Filled 2018-07-12: qty 7

## 2018-07-12 MED ORDER — VENLAFAXINE HCL ER 75 MG PO CP24
75.0000 mg | ORAL_CAPSULE | Freq: Every day | ORAL | Status: DC
Start: 2018-07-13 — End: 2018-07-12
  Filled 2018-07-12: qty 7

## 2018-07-12 MED ORDER — HYDROXYZINE HCL 25 MG PO TABS
25.0000 mg | ORAL_TABLET | Freq: Three times a day (TID) | ORAL | Status: DC | PRN
  Filled 2018-07-12: qty 10

## 2018-07-12 NOTE — [facility] Group Notes (Signed)
  [facility] LCSW Group Therapy Note  Date/Time: 07/12/18  Type of Therapy/Topic:  Group Therapy:  Emotion Regulation  Participation Level:  Minimal   Mood:pleasant  Description of Group:    The purpose of this group is to assist patients in learning to regulate negative emotions and experience positive emotions. Patients will be guided to discuss ways in which they have been vulnerable to their negative emotions. These vulnerabilities will be juxtaposed with experiences of positive emotions or situations, and patients challenged to use positive emotions to combat negative ones. Special emphasis will be placed on coping with negative emotions in conflict situations, and patients will process healthy conflict resolution skills.  Therapeutic Goals: 1. Patient will identify two positive emotions or experiences to reflect on in order to balance out negative emotions:  2. Patient will label two or more emotions that they find the most difficult to experience:  3. Patient will be able to demonstrate positive conflict resolution skills through discussion or role plays:   Summary of Patient Progress:Pt shared that jealousy is an emotion that is difficult to experience.  Pt made not active during group discussion about positive ways to manage difficult emotions but did respond to CSW questions..        Therapeutic Modalities:   Cognitive Behavioral Therapy Feelings Identification Dialectical Behavioral Therapy  Daleen SquibbGreg Hunt Zajicek, LCSW

## 2018-07-12 NOTE — Progress Notes (Signed)
D: Patient has been quiet today.  Her affect is flat, however, she is interacting well with her peers.  She is supposed to be discharged tomorrow.  She denies any depressive symptoms or thoughts of self harm.  She has been attending groups and participating in her treatments.  A: Continue to monitor medication management and MD orders.  Safety checks completed every 15 minutes per protocol.  Offer support and encouragement as needed.  R: Patient is receptive to staff; her behavior is appropriate.

## 2018-07-12 NOTE — Progress Notes (Signed)
[facility] MD Progress Note  07/12/2018 12:10 PM SECILIA APPS  MRN:  [id number] Subjective: patient reports she continues to feel better than she did prior to admission. Denies medication side effects at this time. Denies suicidal ideations.  Objective: I have discussed case with treatment team and have met with patient . 32 year old female, presented due to worsening depression, neurovegetative symptoms of depression, auditory hallucinations, suicidal ideations of overdosing. Reports improving mood, states "I am feeling better". Patient presents with more reactive/fuller range of affect. At this time denies medication side effects- on Effexor XR, Abilify. We reviewed medication side effects, to include potential risk of motor/metabolic side effects of Abilify and potential withdrawal symptoms associated with Venlafaxine if stopped abruptly .  (Patient denies any further episodes of dizziness or lightheadedness). She is visible on unit, attending groups, pleasant on approach  Principal Problem:MDD Diagnosis:   Patient Active Problem List   Diagnosis Date Noted  . MDD (major depressive disorder), recurrent severe, without psychosis ([place]) [F33.2] 07/09/2018   Total Time spent with patient: 15 minutes   Past Psychiatric History:   Past Medical History:  Past Medical History:  Diagnosis Date  . Depression    History reviewed. No pertinent surgical history. Family History: History reviewed. No pertinent family history. Family Psychiatric  History: Social History:  Social History   Substance and Sexual Activity  Alcohol Use No     Social History   Substance and Sexual Activity  Drug Use Not Currently    Social History   Socioeconomic History  . Marital status: Divorced    Spouse name: Not on file  . Number of children: Not on file  . Years of education: Not on file  . Highest education level: Not on file  Occupational History  . Not on file  Social Needs  . Financial  resource strain: Not on file  . Food insecurity:    Worry: Not on file    Inability: Not on file  . Transportation needs:    Medical: Not on file    Non-medical: Not on file  Tobacco Use  . Smoking status: Never Smoker  . Smokeless tobacco: Never Used  Substance and Sexual Activity  . Alcohol use: No  . Drug use: Not Currently  . Sexual activity: Not Currently  Lifestyle  . Physical activity:    Days per week: Not on file    Minutes per session: Not on file  . Stress: Not on file  Relationships  . Social connections:    Talks on phone: Not on file    Gets together: Not on file    Attends religious service: Not on file    Active member of club or organization: Not on file    Attends meetings of clubs or organizations: Not on file    Relationship status: Not on file  Other Topics Concern  . Not on file  Social History Narrative  . Not on file   Additional Social History:   Sleep: improving   Appetite:  improving   Current Medications: Current Facility-Administered Medications  Medication Dose Route Frequency Provider Last Rate Last Dose  . acetaminophen (TYLENOL) tablet 650 mg  650 mg Oral Q6H PRN Nanci Pina, FNP      . alum & mag hydroxide-simeth (MAALOX/MYLANTA) 200-200-20 MG/5ML suspension 30 mL  30 mL Oral Q4H PRN Nanci Pina, FNP      . ARIPiprazole (ABILIFY) tablet 5 mg  5 mg Oral Daily Cobos, Myer Peer, MD  5 mg at 07/12/18 4492  . hydrOXYzine (ATARAX/VISTARIL) tablet 25 mg  25 mg Oral TID PRN Nanci Pina, FNP   25 mg at 07/10/18 2219  . magnesium hydroxide (MILK OF MAGNESIA) suspension 30 mL  30 mL Oral Daily PRN Nanci Pina, FNP   30 mL at 07/12/18 0440  . ondansetron (ZOFRAN-ODT) disintegrating tablet 4 mg  4 mg Oral QID PRN Cobos, Myer Peer, MD      . traZODone (DESYREL) tablet 50 mg  50 mg Oral QHS PRN Nanci Pina, FNP      . venlafaxine XR (EFFEXOR-XR) 24 hr capsule 75 mg  75 mg Oral Q breakfast Cobos, Myer Peer, MD   75 mg at  07/12/18 0100    Lab Results:  Results for orders placed or performed during the hospital encounter of 07/09/18 (from the past 48 hour(s))  Glucose, capillary     Status: None   Collection Time: 07/10/18  5:53 PM  Result Value Ref Range   Glucose-Capillary 92 70 - 99 mg/dL    Blood Alcohol level:  Lab Results  Component Value Date   ETH <10 71/21/9758    Metabolic Disorder Labs: Lab Results  Component Value Date   HGBA1C 5.5 07/10/2018   MPG 111 07/10/2018   No results found for: PROLACTIN Lab Results  Component Value Date   CHOL 195 07/10/2018   TRIG 75 07/10/2018   HDL 59 07/10/2018   CHOLHDL 3.3 07/10/2018   VLDL 15 07/10/2018   LDLCALC 121 (H) 07/10/2018    Physical Findings: AIMS: Facial and Oral Movements Muscles of Facial Expression: None, normal Lips and Perioral Area: None, normal Jaw: None, normal Tongue: None, normal,Extremity Movements Upper (arms, wrists, hands, fingers): None, normal Lower (legs, knees, ankles, toes): None, normal, Trunk Movements Neck, shoulders, hips: None, normal, Overall Severity Severity of abnormal movements (highest score from questions above): None, normal Incapacitation due to abnormal movements: None, normal Patient's awareness of abnormal movements (rate only patient's report): No Awareness, Dental Status Current problems with teeth and/or dentures?: No Does patient usually wear dentures?: No  CIWA:    COWS:     Musculoskeletal: Strength & Muscle Tone: within normal limits Gait & Station: normal Patient leans: N/A  Psychiatric Specialty Exam: Physical Exam  ROS-denies chest pain, no shortness of breath, no vomiting, no lightheadedness or dizziness reported  Blood pressure 127/80, pulse 75, temperature 98.1 F (36.7 C), temperature source Oral, resp. rate 20, height 5' 4"  (1.626 m), weight 117 kg, SpO2 100 %.Body mass index is 44.29 kg/m.  General Appearance: Well Groomed  Eye Contact:  Good  Speech:  Normal Rate   Volume:  Normal  Mood:  Reports mood partially but significantly improved  Affect:  More reactive  Thought Process:  Linear and Descriptions of Associations: Intact  Orientation:  Full (Time, Place, and Person)  Thought Content:  No hallucinations, no delusions  Suicidal Thoughts:  No-today denies any suicidal or self-injurious ideations, at this time contracts for safety on unit  Homicidal Thoughts:  No  Memory:  Recent and remote grossly intact  Judgement:  Other:  Improving  Insight:  Improving  Psychomotor Activity:  Normal  Concentration:  Concentration: Good and Attention Span: Good  Recall:  Good  Fund of Knowledge:  Good  Language:  Good  Akathisia:  Negative  Handed:  Right  AIMS (if indicated):     Assets:  Desire for Improvement Resilience  ADL's:  Intact  Cognition:  WNL  Sleep:  Number of Hours: 6.75   Assessment -32 year old female, presented due to worsening depression, neurovegetative symptoms of depression, auditory hallucinations, suicidal ideations of overdosing. Patient is presenting with improving mood and range of affect.  Denies suicidal ideations.  No current psychotic symptoms reported or noted.  Thus far tolerating Effexor XR and Abilify well.  Treatment Plan Summary: Daily contact with patient to assess and evaluate symptoms and progress in treatment, Medication management, Plan Inpatient treatment and Medications as below  Treatment plan reviewed as below today 9/20 Encourage group and milieu participation to work on coping skills and symptom reduction Continue Effexor XR  75 mg daily for depression Continue Abilify 5 mg daily for mood disorder/psychotic symptoms Continue Trazodone 50 mg nightly PRN for insomnia as needed Continue Vistaril 25 mg every 8 hours PRN for anxiety as needed Treatment team working on disposition [place], MD 07/12/2018, 12:10 PM   Patient ID: Anthony Sar, female   DOB: Mar 12, 1986, 32 y.o.   MRN:  [id number]

## 2018-07-12 NOTE — [facility] Group Notes (Signed)
Adult Psychoeducational Group Note  Date:  07/12/2018 Time:  9:16 AM  Group Topic/Focus:  Goals Group:   The focus of this group is to help patients establish daily goals to achieve during treatment and discuss how the patient can incorporate goal setting into their daily lives to aide in recovery.  Participation Level:  Active  Participation Quality:  Appropriate  Affect:  Appropriate  Cognitive:  Alert  Insight: Appropriate  Engagement in Group:  Engaged  Modes of Intervention:  Orientation  Additional Comments:  Pt goal for today is to communicate with social worker and doctor about discharge plan.   Dellia NimsJaquesha M Chandell Attridge 07/12/2018, 9:16 AM

## 2018-07-12 NOTE — Progress Notes (Signed)
Recreation Therapy Notes  Date: 9.20.19 Time: 0930 Location: [address] Dayroom  Group Topic: Stress Management  Goal Area(s) Addresses:  Patient will verbalize importance of using healthy stress management.  Patient will identify positive emotions associated with healthy stress management.   Behavioral Response: Engaged  Intervention: Stress Management  Activity : Progressive Muscle Relaxation.  LRT introduced the stress management technique of progressive muscle relaxation.  LRT read a script and lead patients through a series of techniques that guided them to tense each muscle group individually and then relax them.  Patients were to listen as script was read to engage in activity.  Education:  Stress Management, Discharge Planning.   Education Outcome: Acknowledges edcuation/In group clarification offered/Needs additional education  Clinical Observations/Feedback: Pt attended and participated in activity.    Aleesha Ringstad, LRT/CTRS         Averill Winters A 07/12/2018 12:20 PM 

## 2018-07-12 NOTE — [facility] Group Notes (Signed)
Adult Psychoeducational Group Note  Date:  07/12/2018 Time:  12:13 PM  Group Topic/Focus:  Wellness Toolbox:   The focus of this group is to discuss various aspects of wellness, balancing those aspects and exploring ways to increase the ability to experience wellness.  Patients will create a wellness toolbox for use upon discharge.  Participation Level:  Active  Participation Quality:  Appropriate  Affect:  Appropriate  Cognitive:  Alert  Insight: Appropriate  Engagement in Group:  Engaged  Modes of Intervention:  Orientation  Additional Comments:  Pt participated in group activity  Dellia NimsJaquesha M Kanishk Stroebel 07/12/2018, 12:13 PM

## 2018-07-12 NOTE — Progress Notes (Signed)
Adult Psychoeducational Group Note  Date:  07/12/2018 Time:  5:12 AM  Group Topic/Focus:  Wrap-Up Group:   The focus of this group is to help patients review their daily goal of treatment and discuss progress on daily workbooks.  Participation Level:  Active  Participation Quality:  Appropriate  Affect:  Appropriate  Cognitive:  Appropriate  Insight: Appropriate and Good  Engagement in Group:  Engaged  Modes of Intervention:  Discussion  Additional Comments:  Pt day was a 10. She attend all groups today she feels better. She went outside to get some fresh air. She enjoyed the other patients.  Charna Busmanobinson, Onesimo Lingard Long 07/12/2018, 5:12 AM

## 2018-07-13 NOTE — Progress Notes (Signed)
Adult Psychoeducational Group Note  Date:  07/13/2018 Time:  3:43 AM  Group Topic/Focus:  Wrap-Up Group:   The focus of this group is to help patients review their daily goal of treatment and discuss progress on daily workbooks.  Participation Level:  Active  Participation Quality:  Appropriate  Affect:  Appropriate  Cognitive:  Appropriate  Insight: Appropriate  Engagement in Group:  Engaged  Modes of Intervention:  Discussion  Additional Comments:  Pt stated that her goal was to understand discharge plan.  Pt spoke with doctor and claims she met her goal.  Pt rated the day at a 8/10.  Wyonia Fontanella 07/13/2018, 3:43 AM

## 2018-07-13 NOTE — [facility] Group Notes (Signed)
LCSW Group Therapy Note  07/13/2018   10:00-11:00am   Type of Therapy and Topic:  Group Therapy: Anger Cues and Responses  Participation Level:  Active   Description of Group:   In this group, patients learned how to recognize the physical, cognitive, emotional, and behavioral responses they have to anger-provoking situations.  They identified a recent time they became angry and how they reacted.  They analyzed how their reaction was possibly beneficial and how it was possibly unhelpful.  The group discussed a variety of healthier coping skills that could help with such a situation in the future.  Deep breathing was practiced briefly.  Therapeutic Goals: 1. Patients will remember their last incident of anger and how they felt emotionally and physically, what their thoughts were at the time, and how they behaved. 2. Patients will identify how their behavior at that time worked for them, as well as how it worked against them. 3. Patients will explore possible new behaviors to use in future anger situations. 4. Patients will learn that anger itself is normal and cannot be eliminated, and that healthier reactions can assist with resolving conflict rather than worsening situations.  Summary of Patient Progress:  The patient shared that her most recent time of anger was when she argued with her ex-husband because he was supposed to buy school supplies for 1 of their children while she did so for the other child, and said he did not do so and this forced her to do it.  She stated that she "went off" yelling at him, and was able to examine her physical reactions, thought processes, underlying emotions, and coping skills insightfully.  Therapeutic Modalities:   Cognitive Behavioral Therapy  Lynnell ChadMareida J Grossman-Orr

## 2018-07-13 NOTE — Progress Notes (Signed)
D: Patient observed up and around milieu tonight. Interacting with peers. Cooperative and pleasant. Patient states she is discharging tomorrow and feels positive about that. "I have really missed my kids. I am feeling so much better about things." Patient's affect appropriate, mood pleasant with some mild anxiety. Denies pain, physical complaints.   A: No meds ordered this evening, no prns needed or requested.  Level III obs in place for safety. Emotional support offered. Patient encouraged to complete Suicide Safety Plan before discharge. Encouraged to attend and participate in unit programming.  Fall prevention plan in place and reviewed with patient as pt is a high fall risk due to hx of falls prior to admit.   R: Patient verbalizes understanding of POC, falls prevention education. Patient denies SI/HI/AVH and remains safe on level III obs. Will continue to monitor throughout the night.

## 2018-07-13 NOTE — [facility] Group Notes (Signed)
[facility] Group Notes:  (Nursing)  Date:  07/13/2018  Time:1:30 PM Type of Therapy:  Nurse Education  Participation Level:  Did Not Attend    [name] 07/13/2018, 4:26 PM

## 2018-07-13 NOTE — Progress Notes (Signed)
[facility]  07/13/2018 1:31 PM Ashley Dawson  MRN:  [id number] Subjective: Patient is seen and examined.  Patient is a 32 year old female with a past psychiatric history significant for major depression who is seen in follow-up.  She is doing better.  She stated she felt as though the Effexor and the Abilify were helping.  She had been having auditory hallucinations as well as suicidal ideations prior to admission.  She stated she is not hearing any voices, and not thinking about hurting herself.  She denied any side effects to her current medications.  She is asking to be discharged today.  This was discussed.  She slept 5 hours last night.  Her blood pressure is elevated this morning at 138/96.  She is afebrile.  She is not on any scheduled antihypertensive medications currently. Principal Problem: <principal problem not specified> Diagnosis:   Patient Active Problem List   Diagnosis Date Noted  . MDD (major depressive disorder), recurrent severe, without psychosis ([facility]) [F33.2] 07/09/2018   Total Time spent with patient: 15 minutes  Past Psychiatric History: See admission H&P  Past Medical History:  Past Medical History:  Diagnosis Date  . Depression    History reviewed. No pertinent surgical history. Family History: History reviewed. No pertinent family history. Family Psychiatric  History: See admission H&P Social History:  Social History   Substance and Sexual Activity  Alcohol Use No     Social History   Substance and Sexual Activity  Drug Use Not Currently    Social History   Socioeconomic History  . Marital status: Divorced    Spouse name: Not on file  . Number of children: Not on file  . Years of education: Not on file  . Highest education level: Not on file  Occupational History  . Not on file  Social Needs  . Financial resource strain: Not on file  . Food insecurity:    Worry: Not on file    Inability: Not on file  . Transportation needs:     Medical: Not on file    Non-medical: Not on file  Tobacco Use  . Smoking status: Never Smoker  . Smokeless tobacco: Never Used  Substance and Sexual Activity  . Alcohol use: No  . Drug use: Not Currently  . Sexual activity: Not Currently  Lifestyle  . Physical activity:    Days per week: Not on file    Minutes per session: Not on file  . Stress: Not on file  Relationships  . Social connections:    Talks on phone: Not on file    Gets together: Not on file    Attends religious service: Not on file    Active member of club or organization: Not on file    Attends meetings of clubs or organizations: Not on file    Relationship status: Not on file  Other Topics Concern  . Not on file  Social History Narrative  . Not on file   Additional Social History:                         Sleep: Good  Appetite:  Fair  Current Medications: Current Facility-Administered Medications  Medication Dose Route Frequency Provider Last Rate Last Dose  . acetaminophen (TYLENOL) tablet 650 mg  650 mg Oral Q6H PRN Truman HaywardStarkes, Takia S, FNP      . alum & mag hydroxide-simeth (MAALOX/MYLANTA) 200-200-20 MG/5ML suspension 30 mL  30 mL Oral Q4H  PRN Truman Hayward, FNP      . ARIPiprazole (ABILIFY) tablet 5 mg  5 mg Oral Daily Cobos, Rockey Situ, MD   5 mg at 07/13/18 0848  . hydrOXYzine (ATARAX/VISTARIL) tablet 25 mg  25 mg Oral TID PRN Truman Hayward, FNP   25 mg at 07/10/18 2219  . magnesium hydroxide (MILK OF MAGNESIA) suspension 30 mL  30 mL Oral Daily PRN Truman Hayward, FNP   30 mL at 07/12/18 0440  . ondansetron (ZOFRAN-ODT) disintegrating tablet 4 mg  4 mg Oral QID PRN Cobos, Rockey Situ, MD      . traZODone (DESYREL) tablet 50 mg  50 mg Oral QHS PRN Truman Hayward, FNP      . venlafaxine XR (EFFEXOR-XR) 24 hr capsule 75 mg  75 mg Oral Q breakfast Cobos, Rockey Situ, MD   75 mg at 07/13/18 0848    Lab Results: No results found for this or any previous visit (from the past 48  hour(s)).  Blood Alcohol level:  Lab Results  Component Value Date   ETH <10 07/08/2018    Metabolic Disorder Labs: Lab Results  Component Value Date   HGBA1C 5.5 07/10/2018   MPG 111 07/10/2018   No results found for: PROLACTIN Lab Results  Component Value Date   CHOL 195 07/10/2018   TRIG 75 07/10/2018   HDL 59 07/10/2018   CHOLHDL 3.3 07/10/2018   VLDL 15 07/10/2018   LDLCALC 121 (H) 07/10/2018    Physical Findings: AIMS: Facial and Oral Movements Muscles of Facial Expression: None, normal Lips and Perioral Area: None, normal Jaw: None, normal Tongue: None, normal,Extremity Movements Upper (arms, wrists, hands, fingers): None, normal Lower (legs, knees, ankles, toes): None, normal, Trunk Movements Neck, shoulders, hips: None, normal, Overall Severity Severity of abnormal movements (highest score from questions above): None, normal Incapacitation due to abnormal movements: None, normal Patient's awareness of abnormal movements (rate only patient's report): No Awareness, Dental Status Current problems with teeth and/or dentures?: No Does patient usually wear dentures?: No  CIWA:    COWS:     Musculoskeletal: Strength & Muscle Tone: within normal limits Gait & Station: normal Patient leans: N/A  Psychiatric Specialty Exam: Physical Exam  Nursing note and vitals reviewed. Constitutional: She is oriented to person, place, and time. She appears well-developed and well-nourished.  HENT:  Head: Normocephalic and atraumatic.  Respiratory: Effort normal.  Neurological: She is oriented to person, place, and time.    ROS  Blood pressure (!) 138/96, pulse 94, temperature 98.5 F (36.9 C), temperature source Oral, resp. rate 20, height 5\' 4"  (1.626 m), weight 117 kg, SpO2 100 %.Body mass index is 44.29 kg/m.  General Appearance: Casual  Eye Contact:  Fair  Speech:  Normal Rate  Volume:  Decreased  Mood:  Depressed  Affect:  Congruent  Thought Process:  Coherent  and Descriptions of Associations: Intact  Orientation:  Full (Time, Place, and Person)  Thought Content:  Logical  Suicidal Thoughts:  No  Homicidal Thoughts:  No  Memory:  Immediate;   Fair Recent;   Fair Remote;   Fair  Judgement:  Intact  Insight:  Fair  Psychomotor Activity:  Psychomotor Retardation  Concentration:  Concentration: Fair and Attention Span: Fair  Recall:  [employer] of Knowledge:  Fair  Language:  Fair  Akathisia:  Negative  Handed:  Right  AIMS (if indicated):     Assets:  Communication Skills Desire for Improvement Financial Resources/Insurance Housing Intimacy  Physical Health Resilience Social Support  ADL's:  Intact  Cognition:  WNL  Sleep:  Number of Hours: 6.5     Treatment Plan Summary: Daily contact with patient to assess and evaluate symptoms and progress in treatment, Medication management and Plan : Patient is seen and examined.  Patient is a 32 year old female with a past psychiatric history significant for major depression.  She is seen in follow-up.  #1 major depression-continue Abilify 5 mg p.o. daily for mood, continue Effexor extended release 75 mg p.o. daily for f mood.  #2 hypertension will review with patient if any history of hypertension we will treat accordingly.  #3 disposition planning-probable discharge in 1 to 2 days.  Antonieta Pert, MD 07/13/2018, 1:31 PM

## 2018-07-13 NOTE — Progress Notes (Signed)
D: Ashley Dawson has been cooperative and pleasant today, denying SI, HI, and AVH. She took medications without issue and denied needs, concerns, or questions when asked this a.m. She was disappointed in the early afternoon that she was not discharged today, but she remained appropriate. On her self inventory form, she reported good sleep, good appetite, normal energy level, and good concentration. She rated her depression, hopelessness, and anxiety levels all zero/10 (with 10 being worst).   A: Meds given as ordered. No PRNs requested or given. Q15 safety checks maintained. Support/encouragement offered.  R: Pt remains free from harm and continues with treatment. Will continue to monitor for needs/safety.

## 2018-07-14 MED ORDER — ARIPIPRAZOLE 5 MG PO TABS: 5.0000 mg | ORAL_TABLET | Freq: Every day | ORAL | 0 refills | Status: DC

## 2018-07-14 MED ORDER — HYDROXYZINE HCL 25 MG PO TABS: 25.0000 mg | ORAL_TABLET | Freq: Three times a day (TID) | ORAL | 0 refills | Status: DC | PRN

## 2018-07-14 MED ORDER — TRAZODONE HCL 50 MG PO TABS: 50.0000 mg | ORAL_TABLET | Freq: Every evening | ORAL | 0 refills | Status: DC | PRN

## 2018-07-14 MED ORDER — VENLAFAXINE HCL ER 75 MG PO CP24: 75.0000 mg | ORAL_CAPSULE | Freq: Every day | ORAL | 0 refills | Status: DC

## 2018-07-14 NOTE — [facility] Group Notes (Signed)
[facility] LCSW Group Therapy Note  07/14/2018  9:00-10:00AM  Type of Therapy and Topic:  Group Therapy:  Being Your Own Support  Participation Level:  Active   Description of Group:  Patients in this group were introduced to the concept that self-support is an essential part of recovery.  A song entitled "My Own Hero" was played and a group discussion ensued in which patients stated they could relate to the song and it inspired them to realize they have be willing to help themselves in order to succeed, because other people cannot achieve sobriety or stability for them.  We discussed adding a variety of healthy supports to address the various needs in their lives.  A song was played called "I Know Where I've Been" toward the end of group and used to conduct an inspirational wrap-up to group of remembering how far they have already come in their journey.  Therapeutic Goals: 1)  demonstrate the importance of being a part of one's own support system 2)  discuss reasons people in one's life may eventually be unable to be continually supportive  3)  identify the patient's current support system and   4)  elicit commitments to add healthy supports and to become more conscious of being self-supportive   Summary of Patient Progress:  The patient expressed that her mother, brother, sister, and 2 friends are healthy supports while the remainder of her family are unhealthy for her.  She fully embraced the idea of being a part of her own healthy support system.   Therapeutic Modalities:   Motivational Interviewing Activity  Lynnell ChadMareida J Grossman-Orr

## 2018-07-14 NOTE — Progress Notes (Signed)
Adult Psychoeducational Group Note  Date:  07/14/2018 Time:  1:26 AM  Group Topic/Focus:  Wrap-Up Group:   The focus of this group is to help patients review their daily goal of treatment and discuss progress on daily workbooks.  Participation Level:  Active  Participation Quality:  Appropriate  Affect:  Appropriate  Cognitive:  Appropriate  Insight: Appropriate  Engagement in Group:  Engaged  Modes of Intervention:  Discussion  Additional Comments: Pt stated her goal for today was to talk with her doctor about her discharge planned. Pt stated she accomplished her goal today and will be discharged on Sunday. Pt rated her over all day a 5 out of 10. Pt stated she attend all groups held today. Pt stated talking with her doctoar and her Social worker about her discharge plan and aftercare plan help improve her day.   Ashley FurnaceChristopher  Ashley Dawson 07/14/2018, 1:26 AM

## 2018-07-14 NOTE — Plan of Care (Signed)
  Problem: Education: Goal: Knowledge of [facility] Education information/materials will improve Outcome: Completed/Met Goal: Verbalization of understanding the information provided will improve Outcome: Completed/Met   Problem: Activity: Goal: Sleeping patterns will improve Outcome: Completed/Met

## 2018-07-14 NOTE — [facility] Suicide Risk Assessment (Signed)
[facility]   Principal Problem: <principal problem not specified> Discharge Diagnoses:  Patient Active Problem List   Diagnosis Date Noted  . MDD (major depressive disorder), recurrent severe, without psychosis (HCC) [F33.2] 07/09/2018    Total Time spent with patient: 15 minutes  Musculoskeletal: Strength & Muscle Tone: within normal limits Gait & Station: normal Patient leans: N/A  Psychiatric Specialty Exam: Review of Systems  All other systems reviewed and are negative.   Blood pressure (!) 107/96, pulse (!) 114, temperature 98.1 F (36.7 C), temperature source Oral, resp. rate 20, height 5\' 4"  (1.626 m), weight 117 kg, SpO2 100 %.Body mass index is 44.29 kg/m.  General Appearance: Casual  Eye Contact::  Fair  Speech:  Normal Rate409  Volume:  Normal  Mood:  Anxious  Affect:  Congruent  Thought Process:  Coherent and Descriptions of Associations: Intact  Orientation:  Full (Time, Place, and Person)  Thought Content:  Logical  Suicidal Thoughts:  No  Homicidal Thoughts:  No  Memory:  Immediate;   Fair Recent;   Fair Remote;   Fair  Judgement:  Intact  Insight:  Fair  Psychomotor Activity:  Normal  Concentration:  Good  Recall:  Good  Fund of Knowledge:Good  Language: Good  Akathisia:  Negative  Handed:  Right  AIMS (if indicated):     Assets:  Communication Skills Desire for Improvement Housing Physical Health Resilience Social Support  Sleep:  Number of Hours: 6.75  Cognition: WNL  ADL's:  Intact   Mental Status Per Nursing Assessment::   On Admission:  Suicidal ideation indicated by patient, Self-harm thoughts, Intention to act on suicide plan  Demographic Factors:  NA  Loss Factors: NA  Historical Factors: Impulsivity  Risk Reduction Factors:   Responsible for children under 33 years of age, Sense of responsibility to family, Living with another person, especially a relative and Positive social  support  Continued Clinical Symptoms:  Depression:   Impulsivity  Cognitive Features That Contribute To Risk:  None    Suicide Risk:  Minimal: No identifiable suicidal ideation.  Patients presenting with no risk factors but with morbid ruminations; may be classified as minimal risk based on the severity of the depressive symptoms  Follow-up Information    [facility]. Go on 07/18/2018.   Specialty:  Behavioral Health Why:  Hospital follow up appointment is Thursday, 07/18/18 at 8:00am. Please be sure to bring your Photo ID, SSN, any insurance information, and any dicharge paperwork from this hospitalization, including your medication list.  Contact information: [address] [place] [address] [phone]           Plan Of Care/Follow-up recommendations:  Activity:  ad lib  Antonieta PertGreg Lawson Clary, MD 07/14/2018, 7:45 AM

## 2018-07-14 NOTE — Progress Notes (Signed)
Patient ID: Ashley Dawson, female   DOB: 1986/01/02, 32 y.o.   MRN: [id number]  Discharge Note  D) Patient discharged to lobby. Patient states readiness for discharge. Patient denies SI/HI, AVH and is not delusional or psychotic. Patient in no acute distress. Patient has completed their Suicide Safety Plan. Patient provided an opportunity to complete and return Patient Satisfaction Survey. Patient provided English as a second language teachereducational resource materials for Suicide Prevention.  A) Written and verbal discharge instructions given to the patient. Patient accepting to information and verbalized understanding. Patient agrees to the discharge plan. Opportunity for questions and concerns presented to patient. Patient denied any further questions or concerns. All belongings returned to patient. Patient signed for return of belongings and discharge paperwork. Patient provided a copy of their Suicide Safety Plan and has been provided a copy of the Patient Satisfaction Survey with return instructions.  R) Patient safely escorted to the lobby. [facility], prescriptions, personal belongings, note for work, follow-up appointment in place and discharge paperwork.

## 2018-07-14 NOTE — Discharge Summary (Signed)
Physician Discharge Summary Note  Patient:  Ashley BibleChristina E Amini is an 32 y.o., female MRN:  [id number] DOB:  February 02, 1986 Patient phone:  [phone] (home)  Patient address:   [address] [place] [address],  Total Time spent with patient: 15 minutes  Date of Admission:  07/09/2018 Date of Discharge: 07/14/2018  Reason for Admission:  Per assessment note:32 year old female, divorced, employed, presented to ED voluntarily reporting worsening depression and suicidal ideations of overdosing . Endorses significant neuro-vegetative symptoms of depression as below, and also reports some auditory hallucinations, described as insulting , telling her she is " no good".States last experienced hallucinations yesterday.  Reports her depression is chronic,intermittent , but has tended to worsen recently. Of note she is not currently on any psychiatric medication or in treatment for her depression.  Principal Problem: MDD (major depressive disorder), recurrent severe, without psychosis [facility](HCC) Discharge Diagnoses: Patient Active Problem List   Diagnosis Date Noted  . MDD (major depressive disorder), recurrent severe, without psychosis (HCC) [F33.2] 07/09/2018    Past Psychiatric History:  admission assessment reports: no prior psychiatric admissions, denies history of suicide attempts, but reports history of suicidal ideations , denies history of self cutting, reports long history of depression, which she states started about 9 years ago, and which she describes as intermittent . Does not endorse history of mania or hypomania. Denies history of PTSD. Denies history of violence .Describes occasional panic attacks and some degree of agoraphobia. Also endorses history of social anxiety, with significant anxiety when having to speak in front of others or feeling at the center of attention  Past Medical History:  Past Medical History:  Diagnosis Date  . Depression    History reviewed. No pertinent  surgical history. Family History: History reviewed. No pertinent family history. Family Psychiatric  History: Social History:  Social History   Substance and Sexual Activity  Alcohol Use No     Social History   Substance and Sexual Activity  Drug Use Not Currently    Social History   Socioeconomic History  . Marital status: Divorced    Spouse name: Not on file  . Number of children: Not on file  . Years of education: Not on file  . Highest education level: Not on file  Occupational History  . Not on file  Social Needs  . Financial resource strain: Not on file  . Food insecurity:    Worry: Not on file    Inability: Not on file  . Transportation needs:    Medical: Not on file    Non-medical: Not on file  Tobacco Use  . Smoking status: Never Smoker  . Smokeless tobacco: Never Used  Substance and Sexual Activity  . Alcohol use: No  . Drug use: Not Currently  . Sexual activity: Not Currently  Lifestyle  . Physical activity:    Days per week: Not on file    Minutes per session: Not on file  . Stress: Not on file  Relationships  . Social connections:    Talks on phone: Not on file    Gets together: Not on file    Attends religious service: Not on file    Active member of club or organization: Not on file    Attends meetings of clubs or organizations: Not on file    Relationship status: Not on file  Other Topics Concern  . Not on file  Social History Narrative  . Not on file    Hospital Course:  Trula OreChristina  E Brazier was admitted for MDD (major depressive disorder), recurrent severe, without psychosis (HCC) and crisis management.  Pt was treated discharged with the medications listed below under Medication List.  Medical problems were identified and treated as needed.  Home medications were restarted as appropriate.  Improvement was monitored by observation and LILLIEANN PAVLICH 's daily report of symptom reduction.  Emotional and mental status was monitored by  daily self-inventory reports completed by Ashley Dawson and clinical staff.         VAIDEHI BRADDY was evaluated by the treatment team for stability and plans for continued recovery upon discharge. REMEE CHARLEY 's motivation was an integral factor for scheduling further treatment. Employment, transportation, bed availability, health status, family support, and any pending legal issues were also considered during hospital stay. Pt was offered further treatment options upon discharge including but not limited to Residential, Intensive Outpatient, and Outpatient treatment.  ANAMARI GALEAS will follow up with the services as listed below under Follow Up Information.     Upon completion of this admission the patient was both mentally and medically stable for discharge denying suicidal/homicidal ideation, auditory/visual/tactile hallucinations, delusional thoughts and paranoia.    Ashley Dawson responded well to treatment with Abilify, Effexor 75 mg and Trazdon 50 mg without adverse effects, improvement without reported or observed adverse effects to the point of stability appropriate for outpatient management. Pertinent labs include: Lipid Panel  for which outpatient follow-up is necessary for lab recheck as mentioned below. Reviewed CBC, CMP, BAL, and UDS; all unremarkable aside from noted exceptions.   Physical Findings: AIMS: Facial and Oral Movements Muscles of Facial Expression: None, normal Lips and Perioral Area: None, normal Jaw: None, normal Tongue: None, normal,Extremity Movements Upper (arms, wrists, hands, fingers): None, normal Lower (legs, knees, ankles, toes): None, normal, Trunk Movements Neck, shoulders, hips: None, normal, Overall Severity Severity of abnormal movements (highest score from questions above): None, normal Incapacitation due to abnormal movements: None, normal Patient's awareness of abnormal movements (rate only patient's report): No  Awareness, Dental Status Current problems with teeth and/or dentures?: No Does patient usually wear dentures?: No  CIWA:    COWS:     Musculoskeletal: Strength & Muscle Tone: within normal limits Gait & Station: normal Patient leans: N/A  Psychiatric Specialty Exam: See SRA by MD Physical Exam  Vitals reviewed. Constitutional: She is oriented to person, place, and time. She appears well-developed.  Neurological: She is alert and oriented to person, place, and time.  Psychiatric: She has a normal mood and affect. Her behavior is normal.    Review of Systems  Psychiatric/Behavioral: Negative for depression and suicidal ideas. The patient is not nervous/anxious and does not have insomnia (improving).   All other systems reviewed and are negative.   Blood pressure (!) 107/96, pulse (!) 114, temperature 98.1 F (36.7 C), temperature source Oral, resp. rate 20, height 5\' 4"  (1.626 m), weight 117 kg, SpO2 100 %.Body mass index is 44.29 kg/m.    Have you used any form of tobacco in the last 30 days? (Cigarettes, Smokeless Tobacco, Cigars, and/or Pipes): No  Has this patient used any form of tobacco in the last 30 days? (Cigarettes, Smokeless Tobacco, Cigars, and/or Pipes) No  Blood Alcohol level:  Lab Results  Component Value Date   ETH <10 07/08/2018    Metabolic Disorder Labs:  Lab Results  Component Value Date   HGBA1C 5.5 07/10/2018   MPG 111 07/10/2018   No results found  for: PROLACTIN Lab Results  Component Value Date   CHOL 195 07/10/2018   TRIG 75 07/10/2018   HDL 59 07/10/2018   CHOLHDL 3.3 07/10/2018   VLDL 15 07/10/2018   LDLCALC 121 (H) 07/10/2018    See Psychiatric Specialty Exam and Suicide Risk Assessment completed by Attending Physician prior to discharge.  Discharge destination:  Home  Is patient on multiple antipsychotic therapies at discharge:  No   Has Patient had three or more failed trials of antipsychotic monotherapy by history:   No  Recommended Plan for Multiple Antipsychotic Therapies: NA  Discharge Instructions    Diet - low sodium heart healthy   Complete by:  As directed    Discharge instructions   Complete by:  As directed    Take all medications as prescribed. Keep all follow-up appointments as scheduled.  Do not consume alcohol or use illegal drugs while on prescription medications. Report any adverse effects from your medications to your primary care provider promptly.  In the event of recurrent symptoms or worsening symptoms, call 911, a crisis hotline, or go to the nearest emergency department for evaluation.   Increase activity slowly   Complete by:  As directed      Allergies as of 07/14/2018   No Known Allergies     Medication List    TAKE these medications     Indication  ARIPiprazole 5 MG tablet Commonly known as:  ABILIFY Take 1 tablet (5 mg total) by mouth daily.  Indication:  Major Depressive Disorder   hydrOXYzine 25 MG tablet Commonly known as:  ATARAX/VISTARIL Take 1 tablet (25 mg total) by mouth 3 (three) times daily as needed for anxiety.  Indication:  Feeling Anxious   traZODone 50 MG tablet Commonly known as:  DESYREL Take 1 tablet (50 mg total) by mouth at bedtime as needed for sleep.  Indication:  Anxiety Disorder   venlafaxine XR 75 MG 24 hr capsule Commonly known as:  EFFEXOR-XR Take 1 capsule (75 mg total) by mouth daily with breakfast.  Indication:  Major Depressive Disorder      Follow-up Information    [facility]. Go on 07/18/2018.   Specialty:  Behavioral Health Why:  Hospital follow up appointment is Thursday, 07/18/18 at 8:00am. Please be sure to bring your Photo ID, SSN, any insurance information, and any dicharge paperwork from this hospitalization, including your medication list.  Contact informationMike Craze [place] [address] [phone]           Follow-up recommendations:  Activity:  as tolerated Diet:  heart healthy  Comments:   Take all medications as prescribed. Keep all follow-up appointments as scheduled.  Do not consume alcohol or use illegal drugs while on prescription medications. Report any adverse effects from your medications to your primary care provider promptly.  In the event of recurrent symptoms or worsening symptoms, call 911, a crisis hotline, or go to the nearest emergency department for evaluation.   Signed: Oneta Rack, NP 07/14/2018, 9:44 AM

## 2018-07-14 NOTE — BHH Group Notes (Signed)
Adult Psychoeducational Group Note  Date:  07/14/2018 Time:  9:00 AM  Group Topic/Focus:  Goals Group:   The focus of this group is to help patients establish daily goals to achieve during treatment and discuss how the patient can incorporate goal setting into their daily lives to aide in recovery.  Participation Level:  Active  Participation Quality:  Appropriate  Affect:  Appropriate  Cognitive:  Alert  Insight: Appropriate  Engagement in Group:  Engaged  Modes of Intervention:  Orientation  Additional Comments:  Pt participated in goals/psycho-ed group session.  Dellia NimsJaquesha M Yanna Leaks 07/14/2018, 9:00 AM

## 2018-07-14 NOTE — Progress Notes (Signed)
  [facility] :  Will you be returning to the same living situation after discharge:  Yes,  with children At discharge, do you have transportation home?: Yes,  arranged by patient Do you have the ability to pay for your medications: No.  Limited income and no insurance, will need assistance  Release of information consent forms completed and turned in to Medical Records by CSW.   Patient to Follow up at: Follow-up Information    [facility]. Go on 07/18/2018.   Specialty:  Behavioral Health Why:  Hospital follow up appointment is Thursday, 07/18/18 at 8:00am. Please be sure to bring your Photo ID, SSN, any insurance information, and any dicharge paperwork from this hospitalization, including your medication list.  Contact information: [address] [place] [address] [phone]           Next level of care provider [facility] Link:no  Safety Planning and Suicide Prevention discussed: Yes,  with mother  Have you used any form of tobacco in the last 30 days? (Cigarettes, Smokeless Tobacco, Cigars, and/or Pipes): No  Has patient been referred to the Quitline?: N/A patient is not a smoker  Patient has been referred for addiction treatment: N/A  Lynnell ChadMareida J Grossman-Orr, LCSW 07/14/2018, 2:01 PM

## 2018-07-14 NOTE — Progress Notes (Addendum)
Patient ID: Ashley BibleChristina E Dawson, female   DOB: 06-22-1986, 32 y.o.   MRN: [id number]  Nursing Progress Note [id number]  Data: Patient presents with animated affect and euthymic mood. Patient is pleasant and cooperative during interactions this morning. Patient complaint with scheduled medications. Patient denies pain/physical complaints. Patient completed self-inventory sheet and rates depression, hopelessness, and anxiety 0,0,0 respectively. Patient rates their sleep and appetite as good/good respectively. Patient states goal for today is to "go home". Patient is seen attending groups and visible in the milieu. Patient currently denies SI/HI/AVH.   Action: Patient is educated about and provided medication per provider's orders. Patient safety maintained with q15 min safety checks and frequent rounding. High fall risk precautions in place. Emotional support given. 1:1 interaction and active listening provided. Patient encouraged to attend meals, groups, and work on treatment plan and goals. Labs, vital signs and patient behavior monitored throughout shift.   Response: Patient remains safe on the unit at this time and agrees to come to staff with any issues/concerns. Patient is interacting with peers appropriately on the unit. Will continue to support and monitor.

## 2018-07-14 NOTE — Progress Notes (Signed)
D: Patient observed up and visible on unit. Patient states she was disappointed she wasn't discharged today but states she should be able to leave tomorrow. Patient's affect appropriate, mood pleasant. "I'm just ready to get home to my kids." Denies pain, physical complaints.   A: No medications scheduled, no prns requested or needed.  Emotional support offered. Patient encouraged to complete Suicide Safety Plan before discharge. Encouraged to attend and participate in unit programming.  Fall prevention plan in place and reviewed with patient as pt is a high fall risk due to hx of frequent falls PTA.   R: Patient verbalizes understanding of POC, falls prevention education. Patient denies SI/HI/AVH and remains safe on level III obs. Will continue to monitor throughout the night.

## 2018-08-18 ENCOUNTER — Ambulatory Visit (HOSPITAL_COMMUNITY)
Admission: EM | Admit: 2018-08-18 | Discharge: 2018-08-18 | Disposition: A | Discharge status: Home / Self Care | Payer: Commercial Managed Care - PPO | Attending: Family Medicine | Admitting: Family Medicine

## 2018-08-18 ENCOUNTER — Encounter (HOSPITAL_COMMUNITY): Payer: Self-pay | Admitting: *Deleted

## 2018-08-18 DIAGNOSIS — M5412 Radiculopathy, cervical region: Secondary | ICD-10-CM | POA: Diagnosis not present

## 2018-08-18 DIAGNOSIS — M79602 Pain in left arm: Secondary | ICD-10-CM

## 2018-08-18 DIAGNOSIS — R202 Paresthesia of skin: Secondary | ICD-10-CM

## 2018-08-18 DIAGNOSIS — Z3202 Encounter for pregnancy test, result negative: Secondary | ICD-10-CM | POA: Diagnosis not present

## 2018-08-18 HISTORY — DX: Anxiety disorder, unspecified: F41.9

## 2018-08-18 LAB — POCT PREGNANCY, URINE: Preg Test, Ur: NEGATIVE

## 2018-08-18 MED ORDER — METHYLPREDNISOLONE 4 MG PO TBPK: ORAL_TABLET | ORAL | 0 refills | Status: DC

## 2018-08-18 MED ORDER — CYCLOBENZAPRINE HCL 5 MG PO TABS: 5.0000 mg | ORAL_TABLET | Freq: Three times a day (TID) | ORAL | 0 refills | Status: DC | PRN

## 2018-08-18 NOTE — ED Triage Notes (Signed)
Denies injury.  C/O left shoulder pain radiating down entire LUE with tingling in all left fingers.  C/O painful ROM of LUE.

## 2018-08-18 NOTE — ED Provider Notes (Signed)
[facility]    CSN: [id number] Arrival date & time: 08/18/18  1002     History   Chief Complaint Chief Complaint  Patient presents with  . Arm Pain    HPI Ashley Dawson is a 32 y.o. female.   HPI  Patient states that she has had numbness and pain in her left arm since yesterday.  She works as a Hospital doctor.  It came on while she was driving, gradually.  She states she slept well the night before, but not last night.  No accident or injury.  No old neck injury.  No old motor vehicle accident.  No known disc disease.  Normal strength.  Normal dexterity.  She has numbness in the third fourth and fifth fingers of mostly.  She states is a good bit better today than it was yesterday.  She is taking no medications.  Heavy lifting or overuse.  No fall or injury.  Past Medical History:  Diagnosis Date  . Anxiety   . Depression     Patient Active Problem List   Diagnosis Date Noted  . MDD (major depressive disorder), recurrent severe, without psychosis (HCC) 07/09/2018    Past Surgical History:  Procedure Laterality Date  . CESAREAN SECTION      OB History   None      Home Medications    Prior to Admission medications   Medication Sig Start Date End Date Taking? Authorizing Provider  ARIPiprazole (ABILIFY) 5 MG tablet Take 1 tablet (5 mg total) by mouth daily. 07/14/18  Yes Oneta Rack, NP  venlafaxine XR (EFFEXOR-XR) 75 MG 24 hr capsule Take 1 capsule (75 mg total) by mouth daily with breakfast. 07/14/18  Yes Oneta Rack, NP  cyclobenzaprine (FLEXERIL) 5 MG tablet Take 1 tablet (5 mg total) by mouth 3 (three) times daily as needed for muscle spasms. 08/18/18   Eustace Moore, MD  methylPREDNISolone (MEDROL DOSEPAK) 4 MG TBPK tablet tad 08/18/18   Eustace Moore, MD    Family History Family History  Problem Relation Age of Onset  . Hypertension Mother   . Healthy Father     Social History Social History   Tobacco Use  . Smoking status:  Never Smoker  . Smokeless tobacco: Never Used  Substance Use Topics  . Alcohol use: No  . Drug use: Never     Allergies   Patient has no known allergies.   Review of Systems Review of Systems  Constitutional: Negative for chills and fever.  HENT: Negative for ear pain and sore throat.   Eyes: Negative for pain and visual disturbance.  Respiratory: Negative for cough and shortness of breath.   Cardiovascular: Negative for chest pain and palpitations.  Gastrointestinal: Negative for abdominal pain and vomiting.  Genitourinary: Negative for dysuria and hematuria.  Musculoskeletal: Negative for arthralgias and back pain.  Skin: Negative for color change and rash.  Neurological: Positive for numbness. Negative for seizures and syncope.  All other systems reviewed and are negative.    Physical Exam Triage Vital Signs ED Triage Vitals  Enc Vitals Group     BP 08/18/18 1014 126/76     Pulse Rate 08/18/18 1014 85     Resp 08/18/18 1014 18     Temp 08/18/18 1014 98.1 F (36.7 C)     Temp Source 08/18/18 1014 Oral     SpO2 08/18/18 1014 100 %     Weight --      Height --  Head Circumference --      Peak Flow --      Pain Score 08/18/18 1015 5     Pain Loc --      Pain Edu? --      Excl. in GC? --    No data found.  Updated Vital Signs BP 126/76   Pulse 85   Temp 98.1 F (36.7 C) (Oral)   Resp 18   LMP 07/17/2018 (Approximate)   SpO2 100%      Physical Exam  Constitutional: She appears well-developed and well-nourished. No distress.  No discomfort  HENT:  Head: Normocephalic and atraumatic.  Mouth/Throat: Oropharynx is clear and moist.  Eyes: Pupils are equal, round, and reactive to light. Conjunctivae are normal.  Neck: Normal range of motion.  Cardiovascular: Normal rate.  Pulmonary/Chest: Effort normal. No respiratory distress.  Abdominal: Soft. She exhibits no distension.  Musculoskeletal: Normal range of motion. She exhibits no edema.  Tenderness  in the left upper body of the trapezius.  Full range of motion of the neck.  Positive Spurling's.  Reflexes are intact in both upper extremities at the bicep tricep and brachioradialis.  Grip strength is good.  Sensation is intact.  Neurological: She is alert.  Skin: Skin is warm and dry.  Psychiatric: She has a normal mood and affect. Her behavior is normal.     UC Treatments / Results  Labs (all labs ordered are listed, but only abnormal results are displayed) Labs Reviewed  POCT PREGNANCY, URINE    EKG None  Radiology No results found.  Procedures Procedures (including critical care time)  Medications Ordered in UC Medications - No data to display  Initial Impression / Assessment and Plan / UC Course  I have reviewed the triage vital signs and the nursing notes.  Pertinent labs & imaging results that were available during my care of the patient were reviewed by me and considered in my medical decision making (see chart for details).    Discussed that this is a typical presentation of cervical radiculopathy.  This can be for any cause of nerve inflammation.  It is likely due to the muscle tenderness in her neck.  Possible disc disease or other etiology.  Treat conservatively.  Work-up only if it is a persistent problem or fails to improve.  Final Clinical Impressions(s) / UC Diagnoses   Final diagnoses:  Left arm pain  Cervical radiculitis     Discharge Instructions     Take the medrol dosepak as directed Take all of day one today After this may take ibuprofen or aleve for pain Take the cyclobenzaprine as muscle relaxer Do not take and drive This is useful at bed for help sleeping See your PCP if becomes recurrent   ED Prescriptions    Medication Sig Dispense Auth. Provider   methylPREDNISolone (MEDROL DOSEPAK) 4 MG TBPK tablet tad 21 tablet Eustace Moore, MD   cyclobenzaprine (FLEXERIL) 5 MG tablet Take 1 tablet (5 mg total) by mouth 3 (three) times  daily as needed for muscle spasms. 30 tablet Eustace Moore, MD     Controlled Substance Prescriptions [place] Controlled Substance Registry consulted? Not Applicable   Eustace Moore, MD 08/18/18 1054

## 2018-08-18 NOTE — Discharge Instructions (Addendum)
Take the medrol dosepak as directed Take all of day one today After this may take ibuprofen or aleve for pain Take the cyclobenzaprine as muscle relaxer Do not take and drive This is useful at bed for help sleeping See your PCP if becomes recurrent

## 2018-08-28 DIAGNOSIS — Z23 Encounter for immunization: Secondary | ICD-10-CM | POA: Diagnosis not present

## 2019-01-09 DIAGNOSIS — G4730 Sleep apnea, unspecified: Secondary | ICD-10-CM | POA: Diagnosis not present

## 2019-01-14 DIAGNOSIS — E669 Obesity, unspecified: Secondary | ICD-10-CM | POA: Diagnosis not present

## 2019-01-14 DIAGNOSIS — G4730 Sleep apnea, unspecified: Secondary | ICD-10-CM | POA: Diagnosis not present

## 2019-01-30 DIAGNOSIS — Z13220 Encounter for screening for lipoid disorders: Secondary | ICD-10-CM | POA: Diagnosis not present

## 2019-01-30 DIAGNOSIS — Z0000 Encounter for general adult medical examination without abnormal findings: Secondary | ICD-10-CM | POA: Diagnosis not present

## 2019-01-30 DIAGNOSIS — Z6841 Body Mass Index (BMI) 40.0 and over, adult: Secondary | ICD-10-CM | POA: Diagnosis not present

## 2019-01-30 DIAGNOSIS — N920 Excessive and frequent menstruation with regular cycle: Secondary | ICD-10-CM | POA: Diagnosis not present

## 2019-01-30 DIAGNOSIS — Z131 Encounter for screening for diabetes mellitus: Secondary | ICD-10-CM | POA: Diagnosis not present

## 2019-04-01 IMAGING — CT CT ANGIO CHEST
3 of 7 series · 19 of 36 positions shown · IV contrast (ISOVUE 370)
Comparison: Radiography 08/09/2004

CLINICAL DATA: Ulna distance hairline travel. Syncopal episode.
Elevated D-dimer. Bilateral leg pain and chest pain.

EXAM:
CT ANGIOGRAPHY CHEST WITH CONTRAST
TECHNIQUE: Multidetector CT imaging of the chest was performed using the
standard protocol during bolus administration of intravenous
contrast. Multiplanar CT image reconstructions and MIPs were
obtained to evaluate the vascular anatomy.
CONTRAST:  100 cc Isovue 370

[Series 6: coronal mpr · coronal · 0.51mm/px · 1 of 138 slices shown]
[im 69/138  mediastinal]
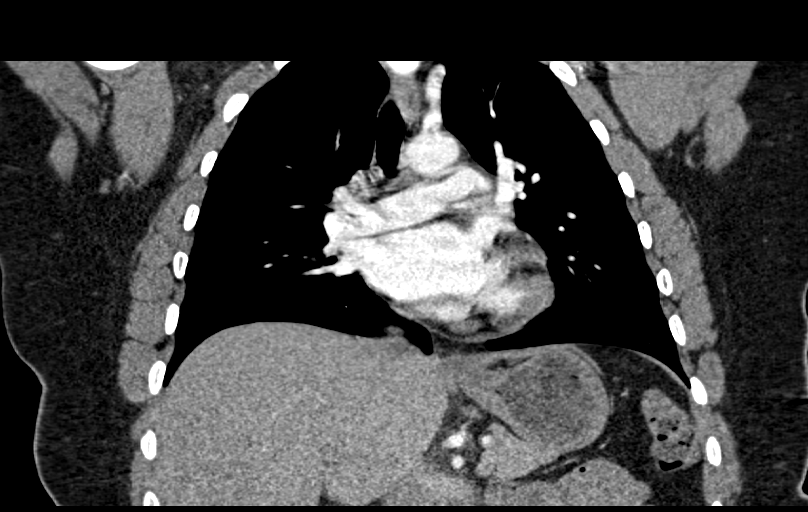

[Series 11: thins for pacs · axial · 0.76mm/px · z∈[+1439,+1669]mm · 15 of 264 slices shown]
[im 17/264  lung]
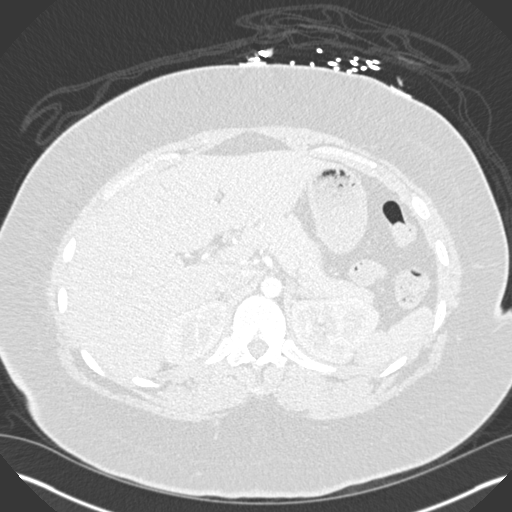
[im 33/264  mediastinal]
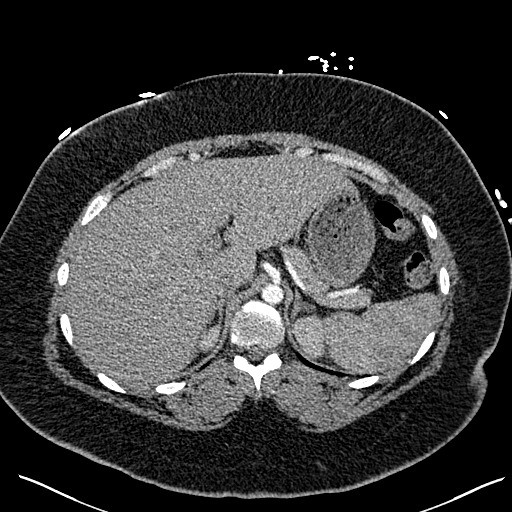
[im 50/264  lung]
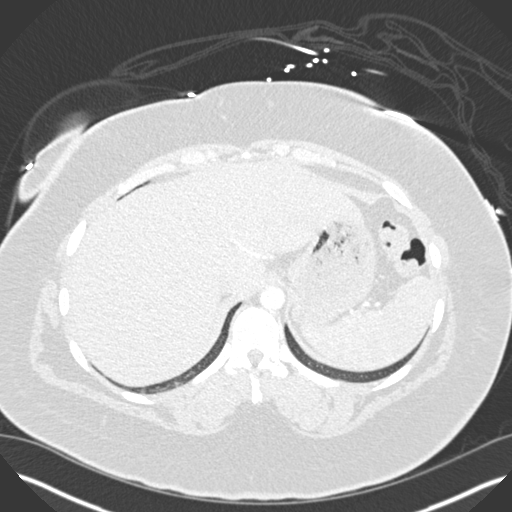
[im 66/264  mediastinal]
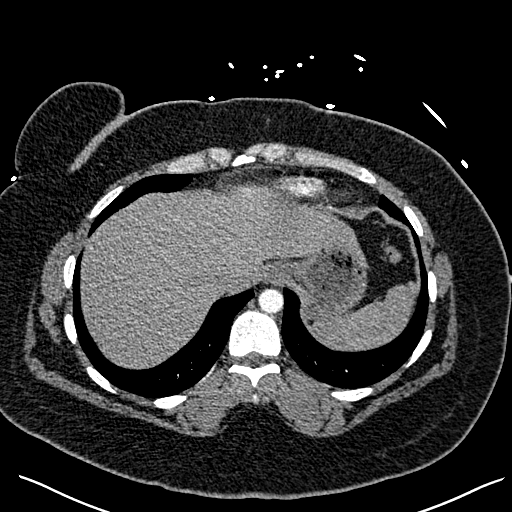
[im 83/264  lung]
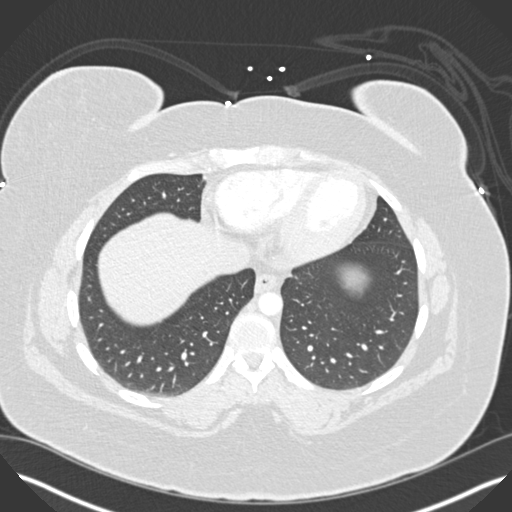
[im 99/264  mediastinal]
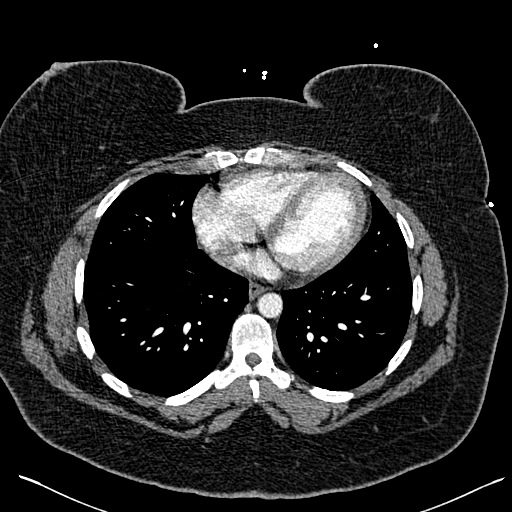
[im 116/264  lung]
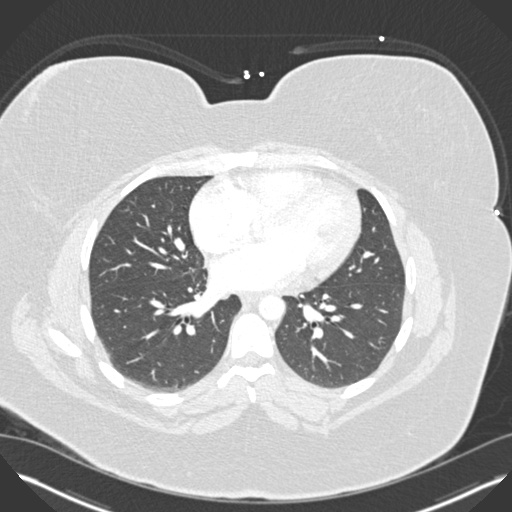
[im 132/264  mediastinal]
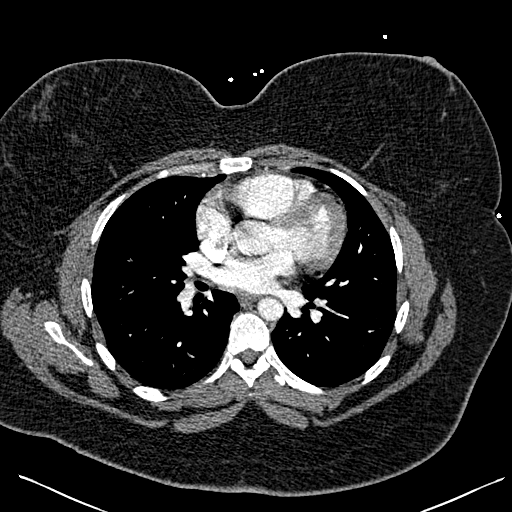
[im 148/264  lung]
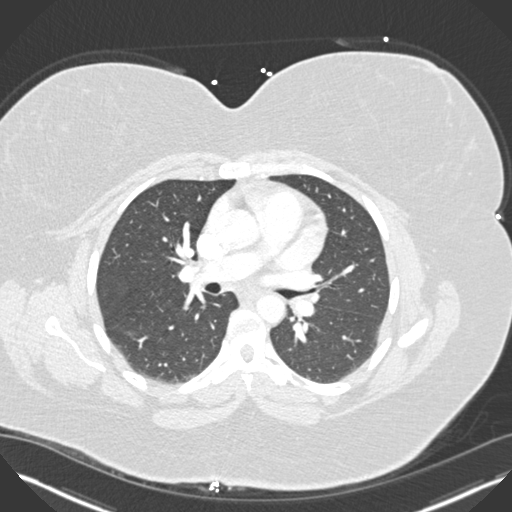
[im 165/264  mediastinal]
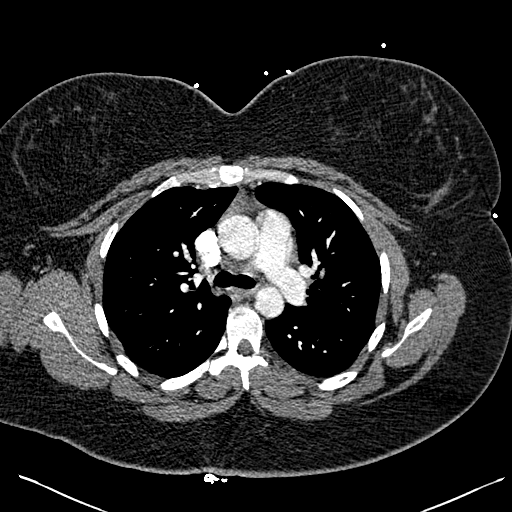
[im 181/264  lung]
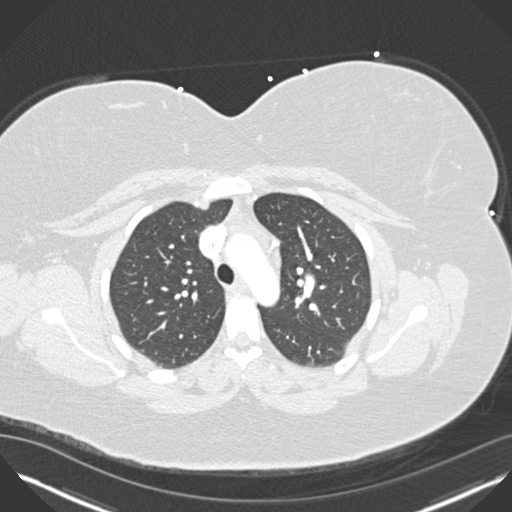
[im 198/264  mediastinal]
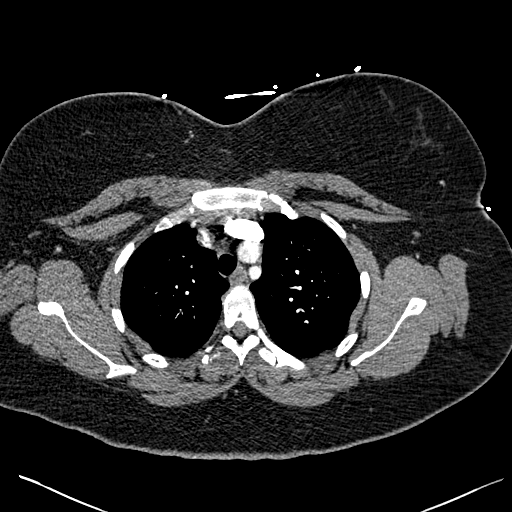
[im 214/264  lung]
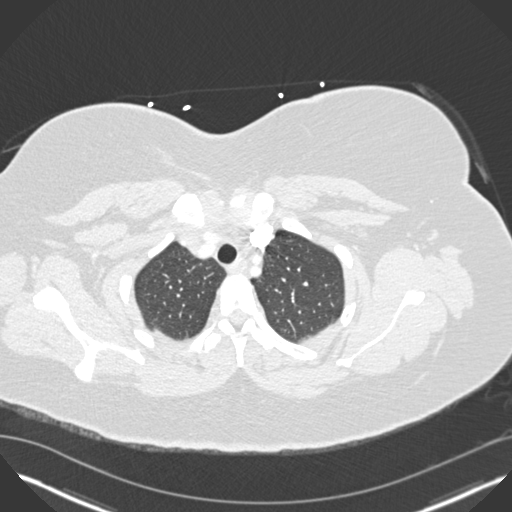
[im 231/264  mediastinal]
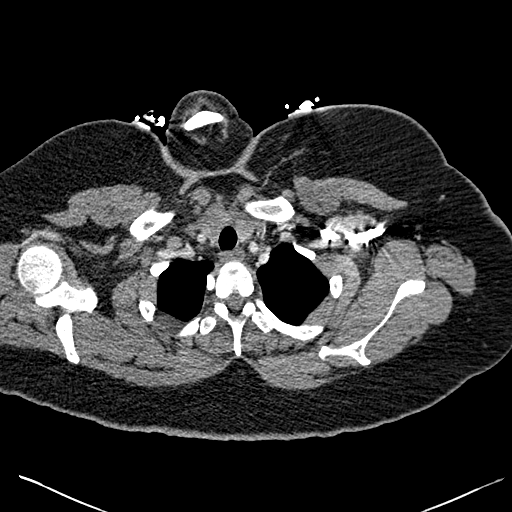
[im 247/264  lung]
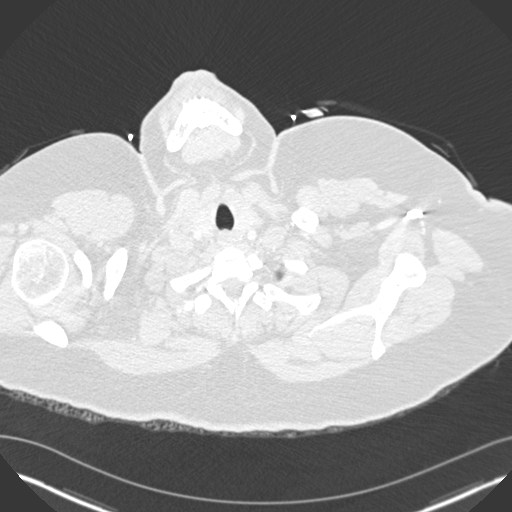

[Series 12: lung windows · axial · 0.76mm/px · z∈[+1512,+1626]mm · 3 of 78 slices shown]
[im 20/78  mediastinal]
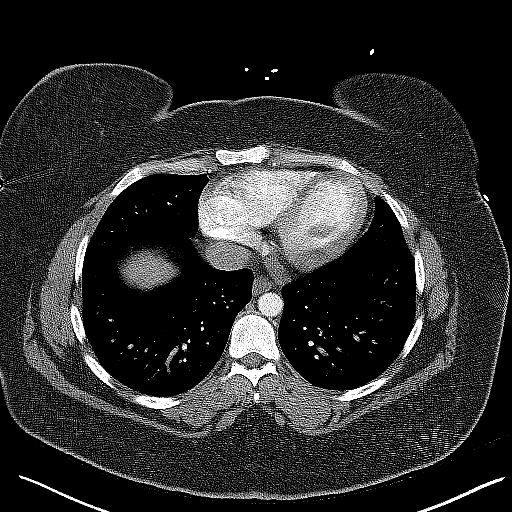
[im 39/78  mediastinal]
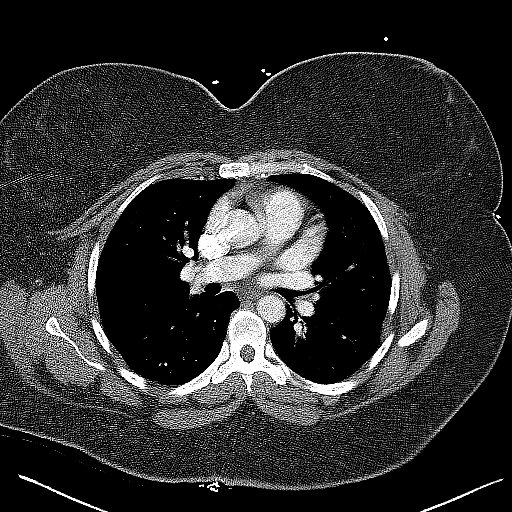
[im 58/78  mediastinal]
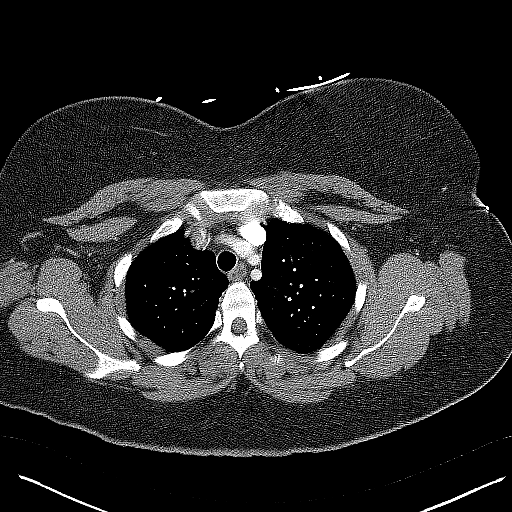

[19 of 36 positions shown; findings below may reference images not displayed]

FINDINGS: Cardiovascular: Pulmonary arterial opacification is moderate to
good. No pulmonary emboli. The aorta is normal. No coronary artery
calcification. Heart size is normal. No pericardial fluid.

Mediastinum/Nodes: No mediastinal mass or lymphadenopathy.

Lungs/Pleura: The lungs are entirely clear. No infiltrate, mass or
collapse. No pleural fluid or pleural mass.

Upper Abdomen: Normal

Musculoskeletal: Normal

Review of the MIP images confirms the above findings.
IMPRESSION: Normal CT angiography of the chest. No pulmonary emboli or other
chest pathology identified.

## 2020-01-08 ENCOUNTER — Ambulatory Visit: Payer: Self-pay | Attending: Internal Medicine

## 2020-12-25 ENCOUNTER — Emergency Department (HOSPITAL_COMMUNITY)
Admission: EM | Admit: 2020-12-25 | Discharge: 2020-12-26 | Disposition: A | Discharge status: Home / Self Care | Payer: Commercial Managed Care - PPO | Attending: Emergency Medicine | Admitting: Emergency Medicine

## 2020-12-25 ENCOUNTER — Encounter (HOSPITAL_COMMUNITY): Payer: Self-pay | Admitting: Emergency Medicine

## 2020-12-25 DIAGNOSIS — R059 Cough, unspecified: Secondary | ICD-10-CM | POA: Diagnosis present

## 2020-12-25 DIAGNOSIS — U071 COVID-19: Secondary | ICD-10-CM | POA: Diagnosis not present

## 2020-12-25 NOTE — ED Triage Notes (Signed)
Pt presents with sore throat, chills, HA, generalized body aches, cough, nasal drainage, sneezing. Pt reports daughter sick last week with a cold. Pt is vaccinated.

## 2020-12-26 LAB — RESP PANEL BY RT-PCR (FLU A&B, COVID) ARPGX2
Influenza A by PCR: NEGATIVE
Influenza B by PCR: NEGATIVE
SARS Coronavirus 2 by RT PCR: POSITIVE — AB

## 2020-12-26 NOTE — ED Provider Notes (Signed)
[facility] EMERGENCY DEPARTMENT Provider Note   CSN: [id number] Arrival date & time: 12/25/20  2148     History Chief Complaint  Patient presents with  . Chills    COVID+    Ashley Dawson is a 35 y.o. female.  Patient presents to the emergency department with URI symptoms.  Symptoms have been present for 2 days.  Patient reports her daughter was sick last week.  Patient complaining of nasal congestion, sore throat and cough without shortness of breath.  She has had generalized malaise and body aches.  Patient has had 2 Covid vaccination shots.        Past Medical History:  Diagnosis Date  . Anxiety   . Depression     Patient Active Problem List   Diagnosis Date Noted  . MDD (major depressive disorder), recurrent severe, without psychosis (HCC) 07/09/2018    Past Surgical History:  Procedure Laterality Date  . CESAREAN SECTION       OB History   No obstetric history on file.     Family History  Problem Relation Age of Onset  . Hypertension Mother   . Healthy Father     Social History   Tobacco Use  . Smoking status: Never Smoker  . Smokeless tobacco: Never Used  Vaping Use  . Vaping Use: Never used  Substance Use Topics  . Alcohol use: No  . Drug use: Never    Home Medications Prior to Admission medications   Medication Sig Start Date End Date Taking? Authorizing Provider  ARIPiprazole (ABILIFY) 5 MG tablet Take 1 tablet (5 mg total) by mouth daily. 07/14/18   Oneta Rack, NP  cyclobenzaprine (FLEXERIL) 5 MG tablet Take 1 tablet (5 mg total) by mouth 3 (three) times daily as needed for muscle spasms. 08/18/18   Eustace Moore, MD  methylPREDNISolone (MEDROL DOSEPAK) 4 MG TBPK tablet tad 08/18/18   Eustace Moore, MD  venlafaxine XR (EFFEXOR-XR) 75 MG 24 hr capsule Take 1 capsule (75 mg total) by mouth daily with breakfast. 07/14/18   Oneta Rack, NP    Allergies    Patient has no known allergies.  Review  of Systems   Review of Systems  Constitutional: Positive for chills.  HENT: Positive for congestion and sore throat.   Respiratory: Positive for cough. Negative for shortness of breath.   All other systems reviewed and are negative.   Physical Exam Updated Vital Signs BP 138/76 (BP Location: Right Arm)   Pulse (!) 116   Temp 98.2 F (36.8 C) (Oral)   Resp 18   LMP 12/23/2020   SpO2 99%   Physical Exam Vitals and nursing note reviewed.  Constitutional:      General: She is not in acute distress.    Appearance: Normal appearance. She is well-developed and well-nourished.  HENT:     Head: Normocephalic and atraumatic.     Right Ear: Hearing normal.     Left Ear: Hearing normal.     Nose: Nose normal.     Mouth/Throat:     Mouth: Oropharynx is clear and moist and mucous membranes are normal.  Eyes:     Extraocular Movements: EOM normal.     Conjunctiva/sclera: Conjunctivae normal.     Pupils: Pupils are equal, round, and reactive to light.  Cardiovascular:     Rate and Rhythm: Regular rhythm.     Heart sounds: S1 normal and S2 normal. No murmur heard. No friction rub. No  gallop.   Pulmonary:     Effort: Pulmonary effort is normal. No respiratory distress.     Breath sounds: Normal breath sounds.  Chest:     Chest wall: No tenderness.  Abdominal:     General: Bowel sounds are normal.     Palpations: Abdomen is soft. There is no hepatosplenomegaly.     Tenderness: There is no abdominal tenderness. There is no guarding or rebound. Negative signs include Murphy's sign and McBurney's sign.     Hernia: No hernia is present.  Musculoskeletal:        General: Normal range of motion.     Cervical back: Normal range of motion and neck supple.  Skin:    General: Skin is warm, dry and intact.     Findings: No rash.     Nails: There is no cyanosis.  Neurological:     Mental Status: She is alert and oriented to person, place, and time.     GCS: GCS eye subscore is 4. GCS verbal  subscore is 5. GCS motor subscore is 6.     Cranial Nerves: No cranial nerve deficit.     Sensory: No sensory deficit.     Coordination: Coordination normal.     Deep Tendon Reflexes: Strength normal.  Psychiatric:        Mood and Affect: Mood and affect normal.        Speech: Speech normal.        Behavior: Behavior normal.        Thought Content: Thought content normal.     ED Results / Procedures / Treatments   Labs (all labs ordered are listed, but only abnormal results are displayed) Labs Reviewed  RESP PANEL BY RT-PCR (FLU A&B, COVID) ARPGX2 - Abnormal; Notable for the following components:      Result Value   SARS Coronavirus 2 by RT PCR POSITIVE (*)    All other components within normal limits    EKG None  Radiology No results found.  Procedures Procedures   Medications Ordered in ED Medications - No data to display  ED Course  I have reviewed the triage vital signs and the nursing notes.  Pertinent labs & imaging results that were available during my care of the patient were reviewed by me and considered in my medical decision making (see chart for details).    MDM Rules/Calculators/A&P                          Patient appears well.  Lungs are clear and oxygen saturations are 99%.  She has not had any difficulty breathing.  COVID is positive.  As she has been previously vaccinated and appears well, no further work-up or treatment necessary.  Instructed on 5-day quarantine and management of her cold symptoms.  Return precautions given.  Final Clinical Impression(s) / ED Diagnoses Final diagnoses:  COVID-19    Rx / DC Orders ED Discharge Orders    None       Lamorris Knoblock, Canary Brim, MD 12/26/20 0139

## 2020-12-27 ENCOUNTER — Telehealth: Payer: Self-pay

## 2020-12-27 NOTE — Telephone Encounter (Signed)
Called to discuss with patient about COVID-19 symptoms and the use of one of the available treatments for those with mild to moderate Covid symptoms and at a high risk of hospitalization.  Pt appears to qualify for outpatient treatment due to co-morbid conditions and/or a member of an at-risk group in accordance with the FDA Emergency Use Authorization.    Symptom onset: 12/23/20 per ED note Vaccinated: Yes Booster? No Immunocompromised? No Qualifiers: Obesity  Unable to reach pt - Left message and call back number [phone].   Esther Hardy

## 2021-06-13 ENCOUNTER — Emergency Department (HOSPITAL_COMMUNITY)
Admission: EM | Admit: 2021-06-13 | Discharge: 2021-06-13 | Disposition: A | Discharge status: Home / Self Care | Payer: BLUE CROSS/BLUE SHIELD | Attending: Emergency Medicine | Admitting: Emergency Medicine

## 2021-06-13 ENCOUNTER — Emergency Department (HOSPITAL_COMMUNITY): Payer: BLUE CROSS/BLUE SHIELD

## 2021-06-13 ENCOUNTER — Other Ambulatory Visit: Payer: Self-pay

## 2021-06-13 ENCOUNTER — Encounter (HOSPITAL_COMMUNITY): Payer: Self-pay

## 2021-06-13 DIAGNOSIS — R079 Chest pain, unspecified: Secondary | ICD-10-CM | POA: Diagnosis not present

## 2021-06-13 DIAGNOSIS — M79604 Pain in right leg: Secondary | ICD-10-CM | POA: Diagnosis not present

## 2021-06-13 DIAGNOSIS — R42 Dizziness and giddiness: Secondary | ICD-10-CM | POA: Insufficient documentation

## 2021-06-13 LAB — CBC WITH DIFFERENTIAL/PLATELET
Abs Immature Granulocytes: 0.02 10*3/uL (ref 0.00–0.07)
Basophils Absolute: 0.1 10*3/uL (ref 0.0–0.1)
Basophils Relative: 1 %
Eosinophils Absolute: 0.2 10*3/uL (ref 0.0–0.5)
Eosinophils Relative: 2 %
HCT: 45.2 % (ref 36.0–46.0)
Hemoglobin: 14.5 g/dL (ref 12.0–15.0)
Immature Granulocytes: 0 %
Lymphocytes Relative: 34 %
Lymphs Abs: 2.4 10*3/uL (ref 0.7–4.0)
MCH: 26.4 pg (ref 26.0–34.0)
MCHC: 32.1 g/dL (ref 30.0–36.0)
MCV: 82.2 fL (ref 80.0–100.0)
Monocytes Absolute: 0.8 10*3/uL (ref 0.1–1.0)
Monocytes Relative: 11 %
Neutro Abs: 3.6 10*3/uL (ref 1.7–7.7)
Neutrophils Relative %: 52 %
Platelets: 343 10*3/uL (ref 150–400)
RBC: 5.5 MIL/uL — ABNORMAL HIGH (ref 3.87–5.11)
RDW: 15.5 % (ref 11.5–15.5)
WBC: 7.1 10*3/uL (ref 4.0–10.5)
nRBC: 0 % (ref 0.0–0.2)

## 2021-06-13 LAB — LIPASE, BLOOD: Lipase: 35 U/L (ref 11–51)

## 2021-06-13 LAB — COMPREHENSIVE METABOLIC PANEL
ALT: 17 U/L (ref 0–44)
AST: 16 U/L (ref 15–41)
Albumin: 4 g/dL (ref 3.5–5.0)
Alkaline Phosphatase: 58 U/L (ref 38–126)
Anion gap: 8 (ref 5–15)
BUN: 16 mg/dL (ref 6–20)
CO2: 27 mmol/L (ref 22–32)
Calcium: 9.6 mg/dL (ref 8.9–10.3)
Chloride: 104 mmol/L (ref 98–111)
Creatinine, Ser: 1.06 mg/dL — ABNORMAL HIGH (ref 0.44–1.00)
GFR, Estimated: >60 mL/min (ref >60)
Glucose, Bld: 85 mg/dL (ref 70–99)
Potassium: 4.2 mmol/L (ref 3.5–5.1)
Sodium: 139 mmol/L (ref 135–145)
Total Bilirubin: 0.5 mg/dL (ref 0.3–1.2)
Total Protein: 7.3 g/dL (ref 6.5–8.1)

## 2021-06-13 LAB — TROPONIN I (HIGH SENSITIVITY)
Troponin I (High Sensitivity): 9 ng/L (ref <18)
Troponin I (High Sensitivity): 5 ng/L (ref <18)

## 2021-06-13 LAB — I-STAT BETA HCG BLOOD, ED (MC, WL, AP ONLY): I-stat hCG, quantitative: <5 m[IU]/mL (ref <5)

## 2021-06-13 LAB — D-DIMER, QUANTITATIVE: D-Dimer, Quant: <0.27 ug/mL-FEU (ref 0.00–0.50)

## 2021-06-13 NOTE — ED Provider Notes (Signed)
Emergency Medicine Provider Triage Evaluation Note  Ashley Dawson , a 35 y.o. female  was evaluated in triage.  Pt complains of chest pain and leg pain.  She has a history of DVT is not currently on any medications.  She states that she recently had an episode where her right calf was hurting.  She went to turn on the fan while it was hurting and had a syncopal event.  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  She reports ongoing pain in her right calf.  No fevers.  Review of Systems  Positive: CP, Leg pain, tachycardia, recent syncope Negative: Shob  Physical Exam  BP 129/83 (BP Location: Right Arm)   Pulse (!) 102   Temp 99 F (37.2 C)   Resp 16   SpO2 100%  Gen:   Awake, no distress   Resp:  Normal effort  MSK:   Moves extremities without difficulty  Other:  Tachycardia.  Tenderness to palpation right calf.  Medical Decision Making  Medically screening exam initiated at 2:56 PM.  Appropriate orders placed.  BRYNNLY BONET was informed that the remainder of the evaluation will be completed by another provider, this initial triage assessment does not replace that evaluation, and the importance of remaining in the ED until their evaluation is complete.  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  She states she has a history of DVT and is not anticoagulated.  Additionally patient is tachycardic here. Labs are ordered.    Note: Portions of this report may have been transcribed using voice recognition software. Every effort was made to ensure accuracy; however, inadvertent computerized transcription errors may be present    Cristina Gong, PA-C 06/13/21 1458    Tegeler, Canary Brim, MD 06/13/21 [phone]

## 2021-06-13 NOTE — ED Triage Notes (Signed)
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 Alert and oriented, NAD

## 2021-06-13 NOTE — ED Provider Notes (Signed)
[facility] Emergency Department Provider Note MRN:  [id number]  Arrival date & time: 06/13/21     Chief Complaint   Chest pain History of Present Illness   Ashley Dawson is a 35 y.o. year-old female with a history of DVT presenting to the ED with chief complaint of chest pain.  Patient experiencing chest pain today, constant, mild to moderate, described as a soreness, center of the chest, worse with certain positions.  Not changed with breathing.  Denies any shortness of breath.  Also had an episode where she stood up quickly and felt lightheaded today.  Has been having some right leg cramping for a few weeks.  History of DVT.  Not on anticoagulation.  Denies fever or cough, no abdominal pain, no other complaints.  Review of Systems  A complete 10 system review of systems was obtained and all systems are negative except as noted in the HPI and PMH.   Patient's Health History    Past Medical History:  Diagnosis Date   Anxiety    Depression     Past Surgical History:  Procedure Laterality Date   CESAREAN SECTION      Family History  Problem Relation Age of Onset   Hypertension Mother    Healthy Father     Social History   Socioeconomic History   Marital status: Divorced    Spouse name: Not on file   Number of children: Not on file   Years of education: Not on file   Highest education level: Not on file  Occupational History   Not on file  Tobacco Use   Smoking status: Never   Smokeless tobacco: Never  Vaping Use   Vaping Use: Never used  Substance and Sexual Activity   Alcohol use: No   Drug use: Never   Sexual activity: Yes    Birth control/protection: None  Other Topics Concern   Not on file  Social History Narrative   Not on file   Social Determinants of Health   Financial Resource Strain: Not on file  Food Insecurity: Not on file  Transportation Needs: Not on file  Physical Activity: Not on file  Stress: Not on file   Social Connections: Not on file  Intimate Partner Violence: Not on file     Physical Exam   Vitals:   06/13/21 2056 06/13/21 2312  BP: 121/86 113/76  Pulse: 88 71  Resp: 16 17  Temp:    SpO2: 100% 100%    CONSTITUTIONAL: Well-appearing, NAD NEURO:  Alert and oriented x 3, no focal deficits EYES:  eyes equal and reactive ENT/NECK:  no LAD, no JVD CARDIO: Regular rate, well-perfused, normal S1 and S2 PULM:  CTAB no wheezing or rhonchi GI/GU:  normal bowel sounds, non-distended, non-tender MSK/SPINE:  No gross deformities, no edema SKIN:  no rash, atraumatic PSYCH:  Appropriate speech and behavior  *Additional and/or pertinent findings included in MDM below  Diagnostic and Interventional Summary    EKG Interpretation  Date/Time:  Monday June 13 2021 23:10:46 EDT Ventricular Rate:  70 PR Interval:  145 QRS Duration: 101 QT Interval:  380 QTC Calculation: 410 R Axis:   73 Text Interpretation: Sinus rhythm Confirmed by Kennis Carina [phone]) on 06/13/2021 11:19:31 PM       Labs Reviewed  COMPREHENSIVE METABOLIC PANEL - Abnormal; Notable for the following components:      Result Value   Creatinine, Ser 1.06 (*)    All other components within normal limits  CBC WITH DIFFERENTIAL/PLATELET - Abnormal; Notable for the following components:   RBC 5.50 (*)    All other components within normal limits  LIPASE, BLOOD  D-DIMER, QUANTITATIVE  I-STAT BETA HCG BLOOD, ED ([facility], [facility], [facility] ONLY)  TROPONIN I (HIGH SENSITIVITY)  TROPONIN I (HIGH SENSITIVITY)    DG Chest 2 View  Final Result      Medications - No data to display   Procedures  /  Critical Care Procedures  ED Course and Medical Decision Making  I have reviewed the triage vital signs, the nursing notes, and pertinent available records from the EMR.  Listed above are laboratory and imaging tests that I personally ordered, reviewed, and interpreted and then considered in my medical decision making (see below for  details).  VTE is considered given patient's history of such, however patient has no evidence of DVT on exam, no longer has any leg pain, has no swelling.  Her chest pain seems more muscular given it being worse with certain positions.  She arrived mildly tachycardic but this has resolved without intervention.  Has no shortness of breath, no hypoxia, sitting comfortably.  Wells score of 3.  And so concern for PE is low, made lower by the negative D-dimer, negative troponin x2.  At this point it seems most prudent to stop further testing, given the more likely diagnosis of MSK related pain and orthostatic hypotension causing some lightheadedness.  Strict return precautions, appropriate for discharge.    Elmer Sow. Pilar Plate, MD [facility] Emergency Medicine [facility] [email or web address]  Final Clinical Impressions(s) / ED Diagnoses     ICD-10-CM   1. Chest pain, unspecified type  R07.9       ED Discharge Orders     None        Discharge Instructions Discussed with and Provided to Patient:    Discharge Instructions      You were evaluated in the Emergency Department and after careful evaluation, we did not find any emergent condition requiring admission or further testing in the hospital.  Your exam/testing today was overall reassuring.  Testing does not show any evidence of heart strain or blood clots.  Recommend Tylenol or Motrin for your discomfort, more water during the day, and follow-up with a primary care doctor.  Please return to the Emergency Department if you experience any worsening of your condition.  Thank you for allowing [place] to be a part of your care.        Sabas Sous, MD 06/13/21 [phone]

## 2021-06-13 NOTE — Discharge Instructions (Addendum)
You were evaluated in the Emergency Department and after careful evaluation, we did not find any emergent condition requiring admission or further testing in the hospital.  Your exam/testing today was overall reassuring.  Testing does not show any evidence of heart strain or blood clots.  Recommend Tylenol or Motrin for your discomfort, more water during the day, and follow-up with a primary care doctor.  Please return to the Emergency Department if you experience any worsening of your condition.  Thank you for allowing [place] to be a part of your care.

## 2021-06-14 ENCOUNTER — Other Ambulatory Visit (HOSPITAL_COMMUNITY): Payer: Self-pay | Admitting: Family Medicine

## 2021-06-14 DIAGNOSIS — R52 Pain, unspecified: Secondary | ICD-10-CM

## 2021-06-15 ENCOUNTER — Other Ambulatory Visit: Payer: Self-pay

## 2021-06-15 ENCOUNTER — Ambulatory Visit (HOSPITAL_COMMUNITY)
Admission: RE | Admit: 2021-06-15 | Discharge: 2021-06-15 | Disposition: A | Discharge status: Home / Self Care | Payer: BLUE CROSS/BLUE SHIELD | Source: Ambulatory Visit | Attending: Family Medicine | Admitting: Family Medicine

## 2021-06-15 DIAGNOSIS — R52 Pain, unspecified: Secondary | ICD-10-CM | POA: Insufficient documentation

## 2021-06-15 NOTE — Progress Notes (Signed)
Right lower extremity venous duplex completed. Refer to "CV Proc" under chart review to view preliminary results.  06/15/2021 1:34 PM Eula Fried., MHA, RVT, RDCS, RDMS

## 2021-08-28 ENCOUNTER — Other Ambulatory Visit: Payer: Self-pay

## 2021-08-28 ENCOUNTER — Encounter (HOSPITAL_COMMUNITY): Payer: Self-pay

## 2021-08-28 ENCOUNTER — Ambulatory Visit (HOSPITAL_COMMUNITY)
Admission: EM | Admit: 2021-08-28 | Discharge: 2021-08-28 | Disposition: A | Discharge status: Home / Self Care | Payer: BLUE CROSS/BLUE SHIELD | Attending: Student | Admitting: Student

## 2021-08-28 DIAGNOSIS — N760 Acute vaginitis: Secondary | ICD-10-CM | POA: Diagnosis not present

## 2021-08-28 LAB — POCT URINALYSIS DIPSTICK, ED / UC
Bilirubin Urine: NEGATIVE
Glucose, UA: NEGATIVE mg/dL
Hgb urine dipstick: NEGATIVE
Ketones, ur: 15 mg/dL — AB
Leukocytes,Ua: NEGATIVE
Nitrite: NEGATIVE
Protein, ur: NEGATIVE mg/dL
Specific Gravity, Urine: 1.025 (ref 1.005–1.030)
Urobilinogen, UA: 0.2 mg/dL (ref 0.0–1.0)
pH: 5.5 (ref 5.0–8.0)

## 2021-08-28 LAB — POC URINE PREG, ED: Preg Test, Ur: NEGATIVE

## 2021-08-28 MED ORDER — METRONIDAZOLE 500 MG PO TABS: 500.0000 mg | ORAL_TABLET | Freq: Two times a day (BID) | ORAL | 0 refills | Status: DC

## 2021-08-28 NOTE — Discharge Instructions (Addendum)
-  For bacterial vaginosis, start the antibiotic-Flagyl (metronidazole), 2 pills daily for 7 days.  You can take this with food if you have a sensitive stomach.  Avoid alcohol while taking this medication and for 2 days after as this will cause severe nausea and vomiting. -We'll call in 2 days to discuss any positive test results -Seek additional immediate medical attention if symptoms get worse instead of better despite treatment, including abdominal pain, new symptoms like nausea and vomiting.

## 2021-08-28 NOTE — ED Triage Notes (Signed)
Pt presents with abdominal pain X 3 days. States she had some vaginal discharge. Denies urinary issues.

## 2021-08-28 NOTE — ED Provider Notes (Signed)
[facility]    CSN: [id number] Arrival date & time: 08/28/21  1638      History   Chief Complaint Chief Complaint  Patient presents with   Abdominal Pain    HPI Ashley Dawson is a 35 y.o. female.  Presenting with vaginal discharge and abdominal pain for 3 days.  Medical history noncontributory.  Describes watery discharge and crampy intermittent left lower quadrant pain for about 3 days. External vaginal irritation. Denies new partners or changes in routine like new products.  Denies urinary symptoms like dysuria, frequency, urgency, flank pain, fever/chills.  HPI  Past Medical History:  Diagnosis Date   Anxiety    Depression     Patient Active Problem List   Diagnosis Date Noted   MDD (major depressive disorder), recurrent severe, without psychosis ([place]) 07/09/2018    Past Surgical History:  Procedure Laterality Date   CESAREAN SECTION      OB History   No obstetric history on file.      Home Medications    Prior to Admission medications   Medication Sig Start Date End Date Taking? Authorizing Provider  metroNIDAZOLE (FLAGYL) 500 MG tablet Take 1 tablet (500 mg total) by mouth 2 (two) times daily. Avoid alcohol while taking this medication and for 2 days after 08/28/21  Yes Hazel Sams, PA-C  ARIPiprazole (ABILIFY) 5 MG tablet Take 1 tablet (5 mg total) by mouth daily. 07/14/18   Derrill Center, NP  cyclobenzaprine (FLEXERIL) 5 MG tablet Take 1 tablet (5 mg total) by mouth 3 (three) times daily as needed for muscle spasms. 08/18/18   Raylene Everts, MD  methylPREDNISolone (MEDROL DOSEPAK) 4 MG TBPK tablet tad 08/18/18   Raylene Everts, MD  venlafaxine XR (EFFEXOR-XR) 75 MG 24 hr capsule Take 1 capsule (75 mg total) by mouth daily with breakfast. 07/14/18   Derrill Center, NP    Family History Family History  Problem Relation Age of Onset   Hypertension Mother    Healthy Father     Social History Social History   Tobacco Use    Smoking status: Never   Smokeless tobacco: Never  Vaping Use   Vaping Use: Never used  Substance Use Topics   Alcohol use: No   Drug use: Never     Allergies   Patient has no known allergies.   Review of Systems Review of Systems  Constitutional:  Negative for chills and fever.  HENT:  Negative for sore throat.   Eyes:  Negative for pain and redness.  Respiratory:  Negative for shortness of breath.   Cardiovascular:  Negative for chest pain.  Gastrointestinal:  Positive for abdominal pain. Negative for diarrhea, nausea and vomiting.  Genitourinary:  Positive for vaginal discharge. Negative for decreased urine volume, difficulty urinating, dysuria, flank pain, frequency, genital sores, hematuria and urgency.  Musculoskeletal:  Negative for back pain.  Skin:  Negative for rash.  All other systems reviewed and are negative.   Physical Exam Triage Vital Signs ED Triage Vitals  Enc Vitals Group     BP 08/28/21 1739 122/89     Pulse Rate 08/28/21 1739 82     Resp 08/28/21 1739 20     Temp 08/28/21 1739 98.5 F (36.9 C)     Temp Source 08/28/21 1739 Oral     SpO2 08/28/21 1739 100 %     Weight --      Height --      Head Circumference --  Peak Flow --      Pain Score 08/28/21 1738 5     Pain Loc --      Pain Edu? --      Excl. in GC? --    No data found.  Updated Vital Signs BP 122/89 (BP Location: Right Arm)   Pulse 82   Temp 98.5 F (36.9 C) (Oral)   Resp 20   LMP  (LMP Unknown)   SpO2 100%   Visual Acuity Right Eye Distance:   Left Eye Distance:   Bilateral Distance:    Right Eye Near:   Left Eye Near:    Bilateral Near:     Physical Exam Vitals reviewed.  Constitutional:      General: She is not in acute distress.    Appearance: Normal appearance. She is not ill-appearing.  HENT:     Head: Normocephalic and atraumatic.     Mouth/Throat:     Mouth: Mucous membranes are moist.     Comments: Moist mucous membranes Eyes:     Extraocular  Movements: Extraocular movements intact.     Pupils: Pupils are equal, round, and reactive to light.  Cardiovascular:     Rate and Rhythm: Normal rate and regular rhythm.     Heart sounds: Normal heart sounds.  Pulmonary:     Effort: Pulmonary effort is normal.     Breath sounds: Normal breath sounds. No wheezing, rhonchi or rales.  Abdominal:     General: Bowel sounds are normal. There is no distension.     Palpations: Abdomen is soft. There is no mass.     Tenderness: There is abdominal tenderness in the left lower quadrant. There is no right CVA tenderness, left CVA tenderness, guarding or rebound.     Comments: LLQ pain to deep palpation, without mass guarding rebound  Skin:    General: Skin is warm.     Capillary Refill: Capillary refill takes less than 2 seconds.     Comments: Good skin turgor  Neurological:     General: No focal deficit present.     Mental Status: She is alert and oriented to person, place, and time.  Psychiatric:        Mood and Affect: Mood normal.        Behavior: Behavior normal.     UC Treatments / Results  Labs (all labs ordered are listed, but only abnormal results are displayed) Labs Reviewed  POCT URINALYSIS DIPSTICK, ED / UC - Abnormal; Notable for the following components:      Result Value   Ketones, ur 15 (*)    All other components within normal limits  POC URINE PREG, ED  CERVICOVAGINAL ANCILLARY ONLY    EKG   Radiology No results found.  Procedures Procedures (including critical care time)  Medications Ordered in UC Medications - No data to display  Initial Impression / Assessment and Plan / UC Course  I have reviewed the triage vital signs and the nursing notes.  Pertinent labs & imaging results that were available during my care of the patient were reviewed by me and considered in my medical decision making (see chart for details).     This patient is a very pleasant 35 y.o. year old female presenting with vaginitis.  Afebrile, nontachycardic, LLQ pain to deep palpation but no pain or CVAT.Marland Kitchen.   UA wnl, did not send culture.  U-preg negative. LMP 2 weeks ago. Denies STI risk. Will send self-swab for G/C, trich, yeast, BV  testing. Declines HIV, RPR. Safe sex precautions.   Will treat for suspected BV with flagyl as below.   ED return precautions discussed. Patient verbalizes understanding and agreement.    Final Clinical Impressions(s) / UC Diagnoses   Final diagnoses:  Vaginitis and vulvovaginitis     Discharge Instructions      -For bacterial vaginosis, start the antibiotic-Flagyl (metronidazole), 2 pills daily for 7 days.  You can take this with food if you have a sensitive stomach.  Avoid alcohol while taking this medication and for 2 days after as this will cause severe nausea and vomiting. -We'll call in 2 days to discuss any positive test results -Seek additional immediate medical attention if symptoms get worse instead of better despite treatment, including abdominal pain, new symptoms like nausea and vomiting.     ED Prescriptions     Medication Sig Dispense Auth. Provider   metroNIDAZOLE (FLAGYL) 500 MG tablet Take 1 tablet (500 mg total) by mouth 2 (two) times daily. Avoid alcohol while taking this medication and for 2 days after 14 tablet Rhys Martini, PA-C      PDMP not reviewed this encounter.   Rhys Martini, PA-C 08/28/21 1832

## 2021-08-29 LAB — CERVICOVAGINAL ANCILLARY ONLY
Bacterial Vaginitis (gardnerella): POSITIVE — AB
Candida Glabrata: NEGATIVE
Candida Vaginitis: NEGATIVE
Chlamydia: NEGATIVE
Comment: NEGATIVE
Comment: NEGATIVE
Comment: NEGATIVE
Comment: NEGATIVE
Comment: NEGATIVE
Comment: NORMAL
Neisseria Gonorrhea: NEGATIVE
Trichomonas: NEGATIVE

## 2021-08-30 ENCOUNTER — Other Ambulatory Visit: Payer: Self-pay | Admitting: Home Modifications

## 2021-08-30 DIAGNOSIS — K5900 Constipation, unspecified: Secondary | ICD-10-CM

## 2021-08-30 DIAGNOSIS — R1030 Lower abdominal pain, unspecified: Secondary | ICD-10-CM

## 2021-09-20 ENCOUNTER — Ambulatory Visit
Admission: RE | Admit: 2021-09-20 | Discharge: 2021-09-20 | Disposition: A | Discharge status: Home / Self Care | Payer: BLUE CROSS/BLUE SHIELD | Source: Ambulatory Visit | Attending: Home Modifications | Admitting: Home Modifications

## 2021-09-20 DIAGNOSIS — R1030 Lower abdominal pain, unspecified: Secondary | ICD-10-CM

## 2021-09-20 DIAGNOSIS — K5900 Constipation, unspecified: Secondary | ICD-10-CM

## 2021-09-20 MED ORDER — IOPAMIDOL (ISOVUE-300) INJECTION 61%
100.0000 mL | Freq: Once | INTRAVENOUS | Status: AC | PRN
  Administered 2021-09-20: 100 mL via INTRAVENOUS

## 2021-11-12 ENCOUNTER — Telehealth (HOSPITAL_COMMUNITY): Payer: 59 | Admitting: Psychiatry

## 2021-11-12 ENCOUNTER — Other Ambulatory Visit: Payer: Self-pay

## 2021-11-12 ENCOUNTER — Encounter (HOSPITAL_COMMUNITY): Payer: Self-pay

## 2021-11-26 ENCOUNTER — Encounter (HOSPITAL_COMMUNITY): Payer: Self-pay | Admitting: Psychiatry

## 2021-11-26 ENCOUNTER — Ambulatory Visit (HOSPITAL_BASED_OUTPATIENT_CLINIC_OR_DEPARTMENT_OTHER): Payer: 59 | Admitting: Psychiatry

## 2021-11-26 ENCOUNTER — Other Ambulatory Visit: Payer: Self-pay

## 2021-11-26 VITALS — Wt 258.0 lb

## 2021-11-26 DIAGNOSIS — F419 Anxiety disorder, unspecified: Secondary | ICD-10-CM | POA: Diagnosis not present

## 2021-11-26 DIAGNOSIS — F332 Major depressive disorder, recurrent severe without psychotic features: Secondary | ICD-10-CM | POA: Diagnosis not present

## 2021-11-26 MED ORDER — ARIPIPRAZOLE 5 MG PO TABS: ORAL_TABLET | ORAL | 0 refills | Status: DC

## 2021-11-26 MED ORDER — VENLAFAXINE HCL ER 37.5 MG PO CP24: ORAL_CAPSULE | ORAL | 0 refills | Status: DC
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
## 2021-12-12 ENCOUNTER — Telehealth (HOSPITAL_COMMUNITY): Payer: 59 | Admitting: Psychiatry

## 2021-12-14 ENCOUNTER — Telehealth (HOSPITAL_COMMUNITY): Payer: 59 | Admitting: Psychiatry

## 2021-12-15 ENCOUNTER — Telehealth (HOSPITAL_COMMUNITY): Payer: 59 | Admitting: Psychiatry

## 2021-12-16 ENCOUNTER — Telehealth (HOSPITAL_COMMUNITY): Payer: 59 | Admitting: Psychiatry

## 2021-12-18 ENCOUNTER — Other Ambulatory Visit (HOSPITAL_COMMUNITY): Payer: Self-pay | Admitting: Psychiatry

## 2021-12-18 DIAGNOSIS — F332 Major depressive disorder, recurrent severe without psychotic features: Secondary | ICD-10-CM

## 2021-12-18 DIAGNOSIS — F419 Anxiety disorder, unspecified: Secondary | ICD-10-CM

## 2021-12-21 ENCOUNTER — Telehealth (HOSPITAL_COMMUNITY): Payer: 59 | Admitting: Psychiatry

## 2021-12-21 ENCOUNTER — Other Ambulatory Visit: Payer: Self-pay

## 2021-12-21 ENCOUNTER — Encounter (HOSPITAL_COMMUNITY): Payer: Self-pay

## 2021-12-21 ENCOUNTER — Telehealth (HOSPITAL_BASED_OUTPATIENT_CLINIC_OR_DEPARTMENT_OTHER): Payer: 59 | Admitting: Psychiatry

## 2021-12-21 ENCOUNTER — Encounter (HOSPITAL_COMMUNITY): Payer: Self-pay | Admitting: Psychiatry

## 2021-12-21 DIAGNOSIS — F419 Anxiety disorder, unspecified: Secondary | ICD-10-CM | POA: Diagnosis not present

## 2021-12-21 DIAGNOSIS — F332 Major depressive disorder, recurrent severe without psychotic features: Secondary | ICD-10-CM

## 2021-12-21 MED ORDER — VENLAFAXINE HCL ER 75 MG PO CP24: ORAL_CAPSULE | ORAL | 1 refills | Status: DC

## 2021-12-21 MED ORDER — ARIPIPRAZOLE 5 MG PO TABS: ORAL_TABLET | ORAL | 1 refills | Status: DC

## 2021-12-21 NOTE — Progress Notes (Signed)
Virtual Visit via Video Note ? ?I connected with Ashley Dawson on 12/21/21 at  2:40 PM EST by a video enabled telemedicine application and verified that I am speaking with the correct person using two identifiers. ? ?Location: ?Patient: Home ?Provider: Office ?  ?I discussed the limitations of evaluation and management by telemedicine and the availability of in person appointments. The patient expressed understanding and agreed to proceed. ? ?History of Present Illness: ?Patient is evaluated by a video session.  We started her on Abilify and venlafaxine.  She is taking all her medication at bedtime because she tried taking in the morning and it make her dizzy.  She feels the medicine helping and denies any anger, frustration, crying spells or any hallucination.  She feels her anxiety is also better.  She has no more voices and no more concerns from the medication.  She admitted weight gain and now she is trying to start exercise.  She has a visit with her PCP and her weight was 265.  She admitted anxious about her weight and now hoping to had a better lifestyle.  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  She feels since starting the medication she is not as irritable with the customers.  She is sleeping better.  She will start therapy with Sanjuana Kava in few weeks.  She denies drinking or using any illegal substances.  She lives with her 58 year old son, boyfriend and her daughter.  Her 71 year old son lives with his dad. ? ? ?Past psychiatric history; ?H/O one inpatient in 2019 for severe depression and having suicidal thoughts and plan to take overdose on medication.  Discharged on Abilify, trazodone, hydroxyzine and venlafaxine. ? ?Psychiatric Specialty Exam: ?Physical Exam  ?Review of Systems  ?Weight 265 lb (120.2 kg).There is no height or weight on file to calculate BMI.  ?General Appearance: Casual  ?Eye Contact:  Fair  ?Speech:  Slow  ?Volume:  Decreased  ?Mood:   Anxious  ?Affect:  Appropriate  ?Thought Process:  Goal Directed  ?Orientation:  Full (Time, Place, and Person)  ?Thought Content:  Rumination  ?Suicidal Thoughts:  No  ?Homicidal Thoughts:  No  ?Memory:  Immediate;   Good ?Recent;   Good ?Remote;   Good  ?Judgement:  Good  ?Insight:  Present  ?Psychomotor Activity:  Normal  ?Concentration:  Concentration: Good and Attention Span: Good  ?Recall:  Good  ?Fund of Knowledge:  Good  ?Language:  Good  ?Akathisia:  No  ?Handed:  Right  ?AIMS (if indicated):     ?Assets:  Communication Skills ?Desire for Improvement ?Housing ?Resilience ?Social Support ?Talents/Skills ?Transportation  ?ADL's:  Intact  ?Cognition:  WNL  ?Sleep:   better  ? ? ? ? ?Assessment and Plan: ?Major depressive disorder, recurrent.  Anxiety. ? ?Patient started to notice improvement in her mood and anxiety.  She will start therapy with Sanjuana Kava.  We talk about weight gain and watching her calorie intake and regular exercise.  Patient is hoping to start very soon.  She has no other concern from the medication.  We will continue Abilify 5 mg and Effexor 75 mg daily.  Patient preferred to take all these medication at nighttime because she tried in the morning and it make her dizzy.  I recommended to call us back if is any question or any concern.  Follow-up and 6 weeks. ? ?Follow Up Instructions: ? ?  ?I discussed the assessment and treatment plan with  the patient. The patient was provided an opportunity to ask questions and all were answered. The patient agreed with the plan and demonstrated an understanding of the instructions. ?  ?The patient was advised to call back or seek an in-person evaluation if the symptoms worsen or if the condition fails to improve as anticipated. ? ?I provided 22 minutes of non-face-to-face time during this encounter. ? ? ?Kathlee Nations, MD  ?

## 2022-01-04 ENCOUNTER — Other Ambulatory Visit: Payer: Self-pay

## 2022-01-04 ENCOUNTER — Ambulatory Visit (INDEPENDENT_AMBULATORY_CARE_PROVIDER_SITE_OTHER): Payer: 59 | Admitting: Clinical

## 2022-01-04 DIAGNOSIS — F32A Depression, unspecified: Secondary | ICD-10-CM | POA: Diagnosis not present

## 2022-01-04 DIAGNOSIS — F419 Anxiety disorder, unspecified: Secondary | ICD-10-CM

## 2022-01-04 NOTE — Plan of Care (Signed)
Pt will develop healthy coping methods for mood management as evidenced by journaling daily; walking 5/7 days per week up to 30 minutes. Pt participated in completion of treatment plan. ?
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
## 2022-01-14 ENCOUNTER — Other Ambulatory Visit (HOSPITAL_COMMUNITY): Payer: Self-pay | Admitting: Psychiatry

## 2022-01-14 DIAGNOSIS — F419 Anxiety disorder, unspecified: Secondary | ICD-10-CM

## 2022-01-14 DIAGNOSIS — F332 Major depressive disorder, recurrent severe without psychotic features: Secondary | ICD-10-CM

## 2022-01-24 ENCOUNTER — Ambulatory Visit (INDEPENDENT_AMBULATORY_CARE_PROVIDER_SITE_OTHER): Payer: 59 | Admitting: Clinical

## 2022-01-24 DIAGNOSIS — F419 Anxiety disorder, unspecified: Secondary | ICD-10-CM

## 2022-01-24 DIAGNOSIS — F32A Depression, unspecified: Secondary | ICD-10-CM
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
## 2022-02-01 ENCOUNTER — Ambulatory Visit (HOSPITAL_COMMUNITY): Payer: 59 | Admitting: Clinical

## 2022-02-01 DIAGNOSIS — F419 Anxiety disorder, unspecified: Secondary | ICD-10-CM

## 2022-02-01 DIAGNOSIS — F32A Depression, unspecified: Secondary | ICD-10-CM
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
## 2022-02-09 ENCOUNTER — Telehealth (HOSPITAL_COMMUNITY): Payer: 59 | Admitting: Psychiatry

## 2022-02-15 ENCOUNTER — Encounter (HOSPITAL_COMMUNITY): Payer: Self-pay | Admitting: Psychiatry

## 2022-02-15 ENCOUNTER — Telehealth (HOSPITAL_BASED_OUTPATIENT_CLINIC_OR_DEPARTMENT_OTHER): Payer: Commercial Managed Care - PPO | Admitting: Psychiatry

## 2022-02-15 ENCOUNTER — Ambulatory Visit (HOSPITAL_COMMUNITY): Payer: 59 | Admitting: Clinical

## 2022-02-15 DIAGNOSIS — F419 Anxiety disorder, unspecified: Secondary | ICD-10-CM | POA: Diagnosis not present

## 2022-02-15 DIAGNOSIS — F332 Major depressive disorder, recurrent severe without psychotic features: Secondary | ICD-10-CM | POA: Diagnosis not present

## 2022-02-15 MED ORDER — ARIPIPRAZOLE 5 MG PO TABS: ORAL_TABLET | ORAL | 2 refills | Status: DC

## 2022-02-15 MED ORDER — VENLAFAXINE HCL ER 75 MG PO CP24: ORAL_CAPSULE | ORAL | 2 refills | Status: DC

## 2022-02-15 NOTE — Progress Notes (Signed)
Virtual Visit via Telephone Note ? ?I connected with Ashley Dawson on 02/15/22 at 11:20 AM EDT by telephone and verified that I am speaking with the correct person using two identifiers. ? ?Location: ?Patient: Home ?Provider: Home Office ?  ?I discussed the limitations, risks, security and privacy concerns of performing an evaluation and management service by telephone and the availability of in person appointments. I also discussed with the patient that there may be a patient responsible charge related to this service. The patient expressed understanding and agreed to proceed. ? ? ?History of Present Illness: ?Patient is evaluated by phone session.  She is taking Abilify and venlafaxine.  She feels her anxiety and depression is much better.  She is in therapy with Sanjuana Kava every 2 weeks.  Her sleep is overall improved but she still take 1-2 times a week melatonin.  She denies any crying spells or any feeling of hopelessness.  She denies any panic attack.  Patient is driving because for city of [place].  She is not anxious or irritable.  She is tolerating Abilify and reported no tremors, shakes, EPS.  In the beginning she has some dizziness but it is now resolved.  Patient lives with her 65 year old son, boyfriend and her daughter.  Her older son lives with his father. ? ? ?Past psychiatric history; ?H/O one inpatient in 2019 for severe depression and having suicidal thoughts and plan to take overdose on medication.  Discharged on Abilify, trazodone, hydroxyzine and venlafaxine. ? ?Psychiatric Specialty Exam: ?Physical Exam  ?Review of Systems  ?Weight 265 lb (120.2 kg).There is no height or weight on file to calculate BMI.  ?General Appearance: NA  ?Eye Contact:  NA  ?Speech:  Normal Rate  ?Volume:  Normal  ?Mood:  Euthymic  ?Affect:  NA  ?Thought Process:  Goal Directed  ?Orientation:  Full (Time, Place, and Person)  ?Thought Content:  Logical  ?Suicidal Thoughts:  No  ?Homicidal Thoughts:  No   ?Memory:  Immediate;   Good ?Recent;   Good ?Remote;   Good  ?Judgement:  Good  ?Insight:  Present  ?Psychomotor Activity:  NA  ?Concentration:  Concentration: Good and Attention Span: Good  ?Recall:  Good  ?Fund of Knowledge:  Good  ?Language:  Good  ?Akathisia:  No  ?Handed:  Right  ?AIMS (if indicated):     ?Assets:  Communication Skills ?Desire for Improvement ?Housing ?Resilience ?Social Support ?Talents/Skills  ?ADL's:  Intact  ?Cognition:  WNL  ?Sleep:   better with melatonin  ? ? ? ? ?Assessment and Plan: ?Major depressive disorder, recurrent.  Anxiety. ? ?Patient doing better on the current medication.  Encouraged to continue therapy with Sanjuana Kava.  She is tolerating medication much better.  Continue Abilify 5 mg daily and venlafaxine 75 mg daily.  Recommended to call us back if she has any question or any concern.  Follow-up in 3 months. ? ?Follow Up Instructions: ? ?  ?I discussed the assessment and treatment plan with the patient. The patient was provided an opportunity to ask questions and all were answered. The patient agreed with the plan and demonstrated an understanding of the instructions. ?  ?The patient was advised to call back or seek an in-person evaluation if the symptoms worsen or if the condition fails to improve as anticipated. ? ?Collaboration of Care: Primary Care Provider AEB notes are available in epic to review. ? ?Patient/Guardian was advised Release of Information must be obtained prior to any record release in  order to collaborate their care with an outside provider. Patient/Guardian was advised if they have not already done so to contact the registration department to sign all necessary forms in order for [place] to release information regarding their care.  ? ?Consent: Patient/Guardian gives verbal consent for treatment and assignment of benefits for services provided during this visit. Patient/Guardian expressed understanding and agreed to proceed.   ? ?I provided 21 minutes  of non-face-to-face time during this encounter. ? ? ?Kathlee Nations, MD  ?

## 2022-05-16 ENCOUNTER — Telehealth (HOSPITAL_COMMUNITY): Payer: Commercial Managed Care - PPO | Admitting: Psychiatry

## 2023-04-17 ENCOUNTER — Other Ambulatory Visit: Payer: Self-pay | Admitting: Obstetrics & Gynecology

## 2023-04-19 ENCOUNTER — Other Ambulatory Visit: Payer: Self-pay | Admitting: Obstetrics & Gynecology

## 2023-06-18 ENCOUNTER — Encounter (HOSPITAL_BASED_OUTPATIENT_CLINIC_OR_DEPARTMENT_OTHER): Payer: Self-pay

## 2023-06-18 ENCOUNTER — Ambulatory Visit (HOSPITAL_BASED_OUTPATIENT_CLINIC_OR_DEPARTMENT_OTHER): Admit: 2023-06-18 | Payer: BLUE CROSS/BLUE SHIELD | Admitting: Obstetrics & Gynecology

## 2023-06-18 SURGERY — DILATATION & CURETTAGE/HYSTEROSCOPY WITH NOVASURE ABLATION
Anesthesia: General

## 2023-08-01 ENCOUNTER — Emergency Department (HOSPITAL_COMMUNITY)
Admission: EM | Admit: 2023-08-01 | Discharge: 2023-08-02 | Discharge status: Against Medical Advice | Payer: Commercial Managed Care - PPO | Attending: Emergency Medicine | Admitting: Emergency Medicine

## 2023-08-01 ENCOUNTER — Emergency Department (HOSPITAL_COMMUNITY): Payer: Commercial Managed Care - PPO

## 2023-08-01 ENCOUNTER — Ambulatory Visit (INDEPENDENT_AMBULATORY_CARE_PROVIDER_SITE_OTHER): Payer: Commercial Managed Care - PPO

## 2023-08-01 ENCOUNTER — Ambulatory Visit (HOSPITAL_COMMUNITY)
Admission: EM | Admit: 2023-08-01 | Discharge: 2023-08-01 | Disposition: A | Discharge status: Home / Self Care | Payer: Commercial Managed Care - PPO | Attending: Family Medicine | Admitting: Family Medicine

## 2023-08-01 ENCOUNTER — Encounter (HOSPITAL_COMMUNITY): Payer: Self-pay | Admitting: Emergency Medicine

## 2023-08-01 ENCOUNTER — Encounter (HOSPITAL_COMMUNITY): Payer: Self-pay

## 2023-08-01 DIAGNOSIS — R0609 Other forms of dyspnea: Secondary | ICD-10-CM | POA: Insufficient documentation

## 2023-08-01 DIAGNOSIS — R079 Chest pain, unspecified: Secondary | ICD-10-CM

## 2023-08-01 DIAGNOSIS — Z5321 Procedure and treatment not carried out due to patient leaving prior to being seen by health care provider: Secondary | ICD-10-CM | POA: Insufficient documentation

## 2023-08-01 DIAGNOSIS — R0602 Shortness of breath: Secondary | ICD-10-CM

## 2023-08-01 LAB — HCG, SERUM, QUALITATIVE: Preg, Serum: NEGATIVE

## 2023-08-01 LAB — BASIC METABOLIC PANEL
Anion gap: 14 (ref 5–15)
BUN: 13 mg/dL (ref 6–20)
CO2: 21 mmol/L — ABNORMAL LOW (ref 22–32)
Calcium: 9.4 mg/dL (ref 8.9–10.3)
Chloride: 106 mmol/L (ref 98–111)
Creatinine, Ser: 1.05 mg/dL — ABNORMAL HIGH (ref 0.44–1.00)
GFR, Estimated: >60 mL/min (ref >60)
Glucose, Bld: 91 mg/dL (ref 70–99)
Potassium: 4.3 mmol/L (ref 3.5–5.1)
Sodium: 141 mmol/L (ref 135–145)

## 2023-08-01 LAB — CBC
HCT: 41.4 % (ref 36.0–46.0)
Hemoglobin: 13.3 g/dL (ref 12.0–15.0)
MCH: 25 pg — ABNORMAL LOW (ref 26.0–34.0)
MCHC: 32.1 g/dL (ref 30.0–36.0)
MCV: 77.8 fL — ABNORMAL LOW (ref 80.0–100.0)
Platelets: 363 10*3/uL (ref 150–400)
RBC: 5.32 MIL/uL — ABNORMAL HIGH (ref 3.87–5.11)
RDW: 17.2 % — ABNORMAL HIGH (ref 11.5–15.5)
WBC: 8.7 10*3/uL (ref 4.0–10.5)
nRBC: 0 % (ref 0.0–0.2)

## 2023-08-01 LAB — D-DIMER, QUANTITATIVE: D-Dimer, Quant: 0.59 ug{FEU}/mL — ABNORMAL HIGH (ref 0.00–0.50)

## 2023-08-01 LAB — PROTIME-INR
INR: 1 (ref 0.8–1.2)
Prothrombin Time: 13.2 s (ref 11.4–15.2)

## 2023-08-01 LAB — TROPONIN I (HIGH SENSITIVITY): Troponin I (High Sensitivity): 3 ng/L (ref <18)

## 2023-08-01 NOTE — ED Notes (Signed)
Patient is being discharged from the Urgent Care and sent to the Emergency Department via pov . Per Dr. Tracie Harrier, patient is in need of higher level of care due to Chest pain and SOB. Patient is aware and verbalizes understanding of plan of care.  Vitals:   08/01/23 1715  BP: 137/85  Pulse: 93  Resp: 18  Temp: (!) 97.5 F (36.4 C)  SpO2: 99%

## 2023-08-01 NOTE — ED Triage Notes (Signed)
Pt c/o intermittent center chest pain and SOB on exertion x2 days. States flew to [place] a week and a half ago.

## 2023-08-01 NOTE — ED Notes (Signed)
Pt let tech know that she was leaving.

## 2023-08-01 NOTE — ED Triage Notes (Signed)
Pt has hx of chest pain like this and had DVT years ago. Chest pain starting Monday. Lying down makes it better. Recent travel to [place] 2 weeks ago. Not on blood thinners. States chest pain and SOB feels similar. SOB is only with exertion.

## 2023-08-02 NOTE — ED Provider Notes (Signed)
[facility]   [id number] 08/01/23 Arrival Time: 1658  ASSESSMENT & PLAN:  1. Chest pain, unspecified type   2. Shortness of breath    Cannot r/o PE. H/O DVT. Discussed. She elects ED evaluation. To ED via POV. Stable upon discharge.  I have personally viewed and independently interpreted the imaging studies ordered this visit. No acute changes on CXR.   Reviewed expectations re: course of current medical issues. Questions answered. Outlined signs and symptoms indicating need for more acute intervention. Patient verbalized understanding. After Visit Summary given.   SUBJECTIVE:  History from: patient. Ashley Dawson is a 37 y.o. female who presents with complaint of intermittent center chest pain and SOB on exertion x2 days. States flew to [place] a week and a half ago. Denies LE edema. Does feel SOB here. Afebrile. Mild 2/10 CP here.  Social History   Tobacco Use  Smoking Status Never  Smokeless Tobacco Never   Social History   Substance and Sexual Activity  Alcohol Use No  Denies recreational drug use.   OBJECTIVE:  Vitals:   08/01/23 1715  BP: 137/85  Pulse: 93  Resp: 18  Temp: (!) 97.5 F (36.4 C)  TempSrc: Oral  SpO2: 99%    General appearance: alert, oriented, no acute distress Eyes: PERRLA; EOMI; conjunctivae normal HENT: normocephalic; atraumatic Neck: supple with FROM Lungs: speaks full sentences without difficulty; CTAB Heart: regular rate and rhythm without murmer Chest Wall: without tenderness to palpation Abdomen: soft, non-tender; no guarding or rebound tenderness Extremities: without edema; without calf swelling or tenderness; symmetrical without gross deformities Skin: warm and dry; without rash or lesions Neuro: normal gait Psychological: alert and cooperative; normal mood and affect   Imaging:  DG Chest 2 View  Result Date: 08/01/2023 CLINICAL DATA:  chest pain.  Shortness of breath. EXAM: CHEST - 2 VIEW  COMPARISON:  chest x-ray 06/13/2021. FINDINGS: The heart size and mediastinal contours are within normal limits. Both lungs are clear. No visible pleural effusions or pneumothorax. No acute osseous abnormality. IMPRESSION: No active cardiopulmonary disease. Electronically Signed   By: Feliberto Harts M.D.   On: 08/01/2023 20:07     No Known Allergies  Past Medical History:  Diagnosis Date   Anxiety    Depression    Social History   Socioeconomic History   Marital status: Unknown    Spouse name: Not on file   Number of children: Not on file   Years of education: Not on file   Highest education level: Not on file  Occupational History   Not on file  Tobacco Use   Smoking status: Never   Smokeless tobacco: Never  Vaping Use   Vaping status: Never Used  Substance and Sexual Activity   Alcohol use: No   Drug use: Never   Sexual activity: Yes    Birth control/protection: None  Other Topics Concern   Not on file  Social History Narrative   Not on file   Social Determinants of Health   Financial Resource Strain: Not on file  Food Insecurity: Not on file  Transportation Needs: Not on file  Physical Activity: Not on file  Stress: Not on file  Social Connections: Not on file  Intimate Partner Violence: Not on file   Family History  Problem Relation Age of Onset   Hypertension Mother    Healthy Father    Past Surgical History:  Procedure Laterality Date   CESAREAN SECTION        Kyilee Gregg,  Arlys John, MD 08/02/23 [phone]

## 2023-08-14 IMAGING — CT CT ABD-PELV W/ CM
2 of 4 series · 16 of 46 positions shown, 18 images · IV contrast (iopamidol)
Comparison: None.

CLINICAL DATA: Left-sided lower abdominal pain, constipation
swelling x1 month. History of C-section x3.

EXAM:
CT ABDOMEN AND PELVIS WITH CONTRAST
TECHNIQUE: Multidetector CT imaging of the abdomen and pelvis was performed
using the standard protocol following bolus administration of
intravenous contrast.
CONTRAST:  100mL HHDXK6-G88 IOPAMIDOL (HHDXK6-G88) INJECTION 61%

[Series 2: abd pelvis 5.00 br40 s3 axial · axial · 0.94mm/px · z∈[+1219,+1659]mm · 13 of 98 slices shown, 15 images]
[im 5/98  soft-tissue]
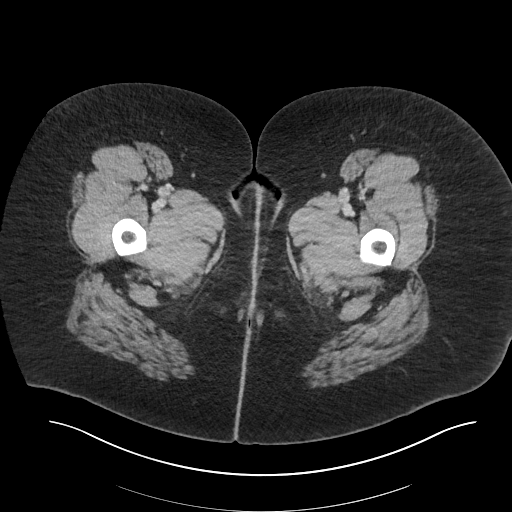
[im 5/98  bone]
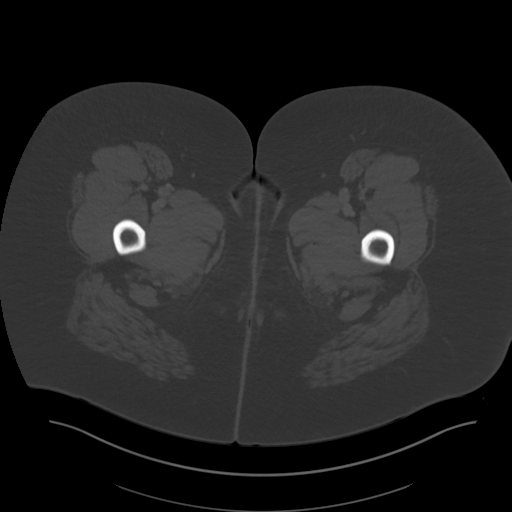
[im 13/98  soft-tissue]
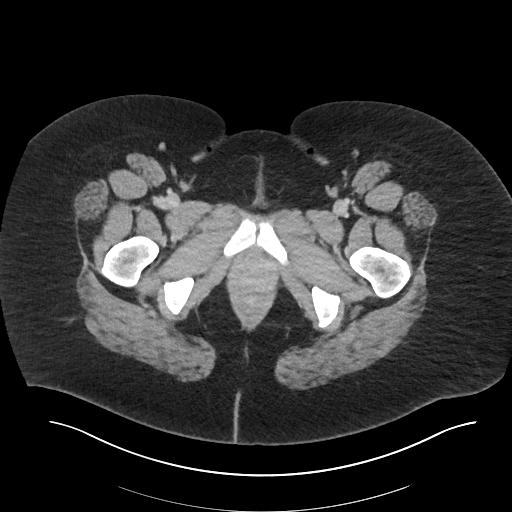
[im 22/98  soft-tissue]
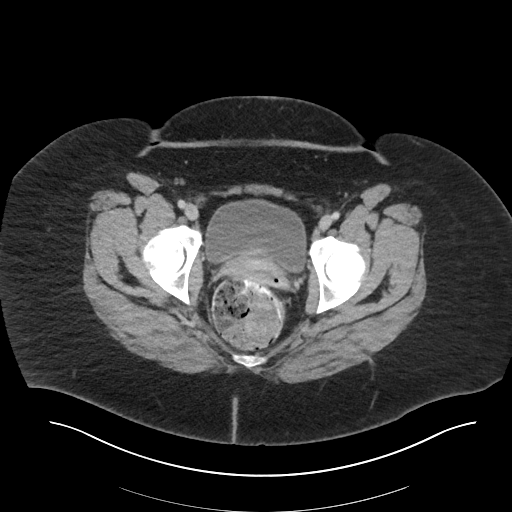
[im 26/98  soft-tissue]
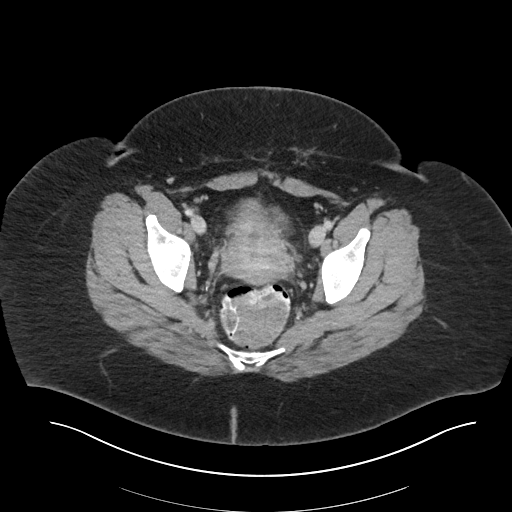
[im 34/98  soft-tissue]
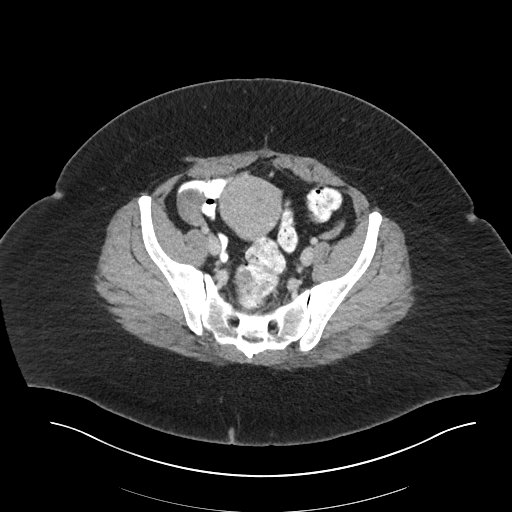
[im 43/98  soft-tissue]
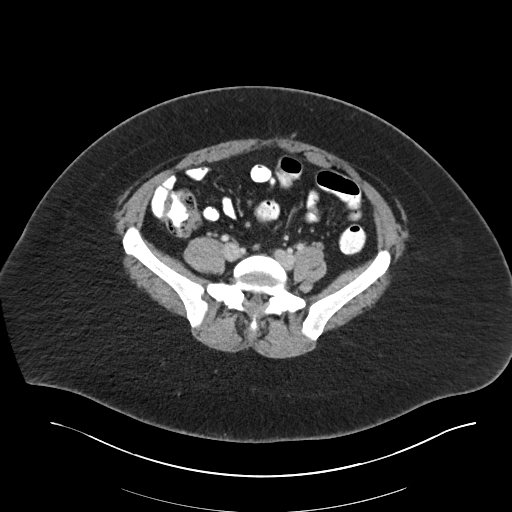
[im 51/98  soft-tissue]
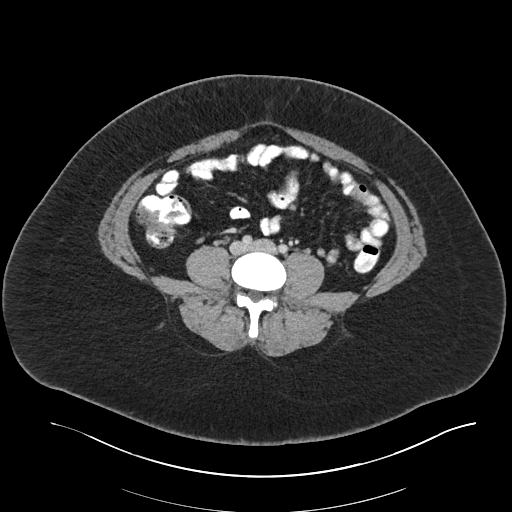
[im 55/98  soft-tissue]
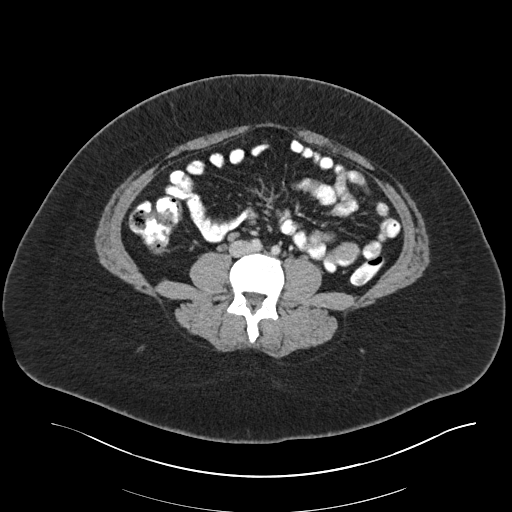
[im 64/98  soft-tissue]
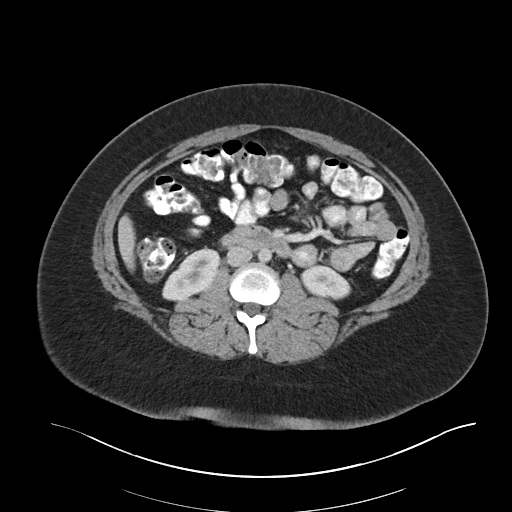
[im 64/98  bone]
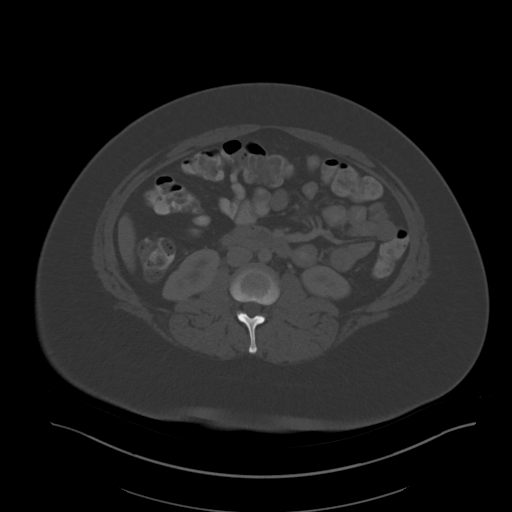
[im 72/98  soft-tissue]
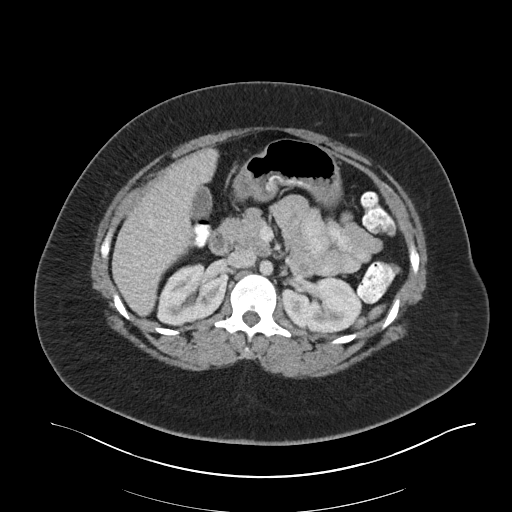
[im 76/98  soft-tissue]
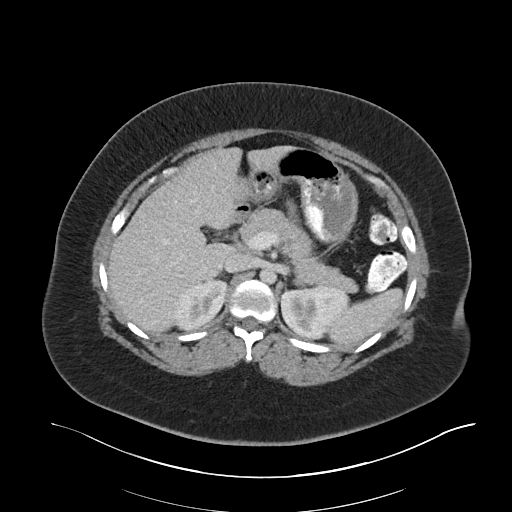
[im 85/98  soft-tissue]
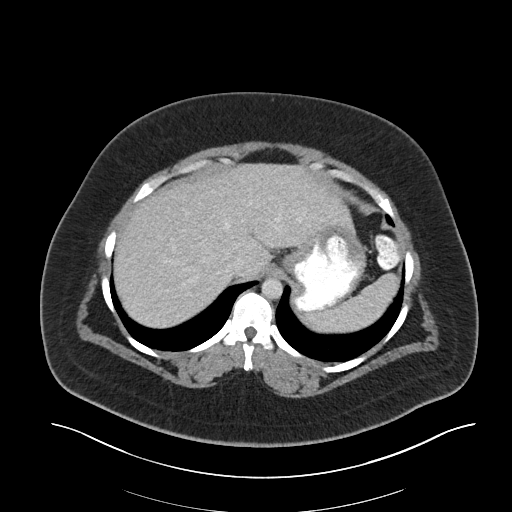
[im 93/98  soft-tissue]
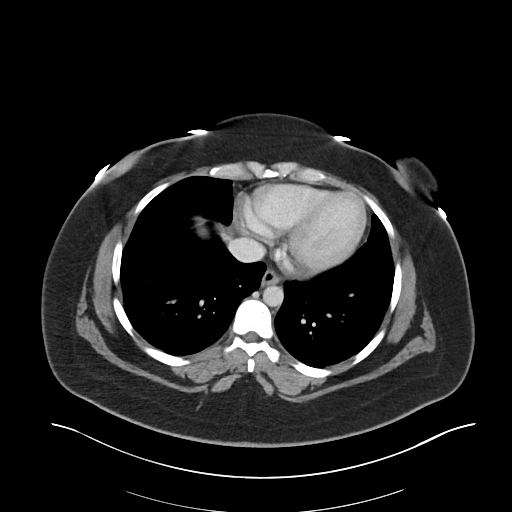

[Series 6: abd pelvis 2.00 br40 s3 cor · coronal · 0.96mm/px · 3 of 221 slices shown]
[im 74/221  soft-tissue]
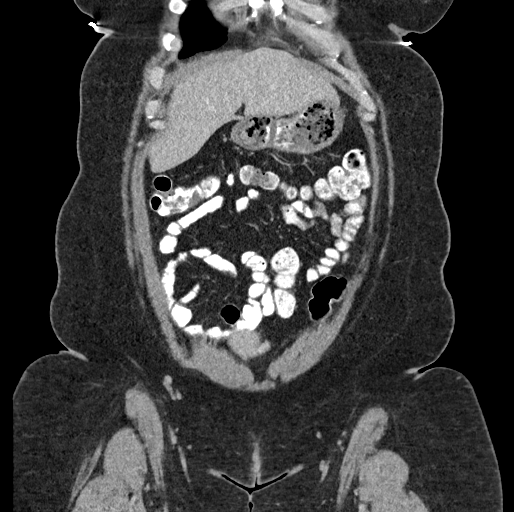
[im 98/221  soft-tissue]
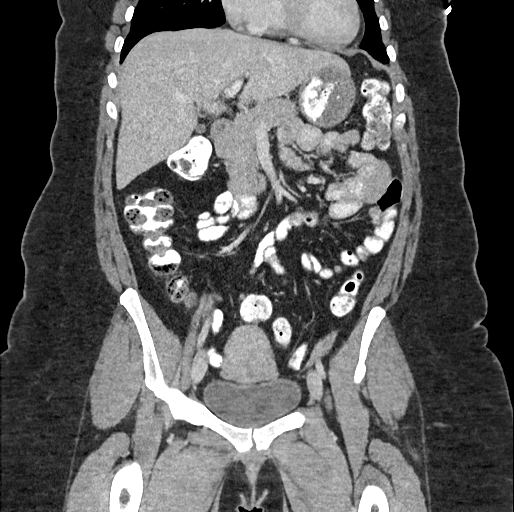
[im 123/221  soft-tissue]
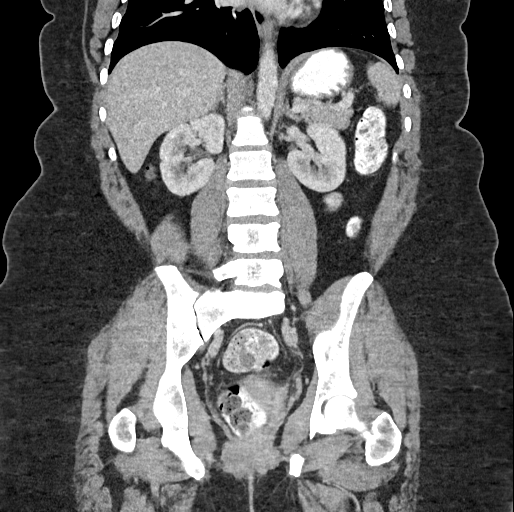

[16 of 46 positions shown; findings below may reference images not displayed]

FINDINGS: Lower chest: No acute abnormality.

Hepatobiliary: No suspicious hepatic lesion. Gallbladder is
unremarkable. No biliary ductal dilation

Pancreas: No pancreatic ductal dilation or evidence of acute
inflammation.

Spleen: Within normal limits.

Adrenals/Urinary Tract: Adrenal glands are unremarkable. Kidneys are
normal, without renal calculi, solid enhancing lesion, or
hydronephrosis. Bladder is unremarkable for degree of distension.

Stomach/Bowel: Radiopaque enteric contrast material traverses the
rectum. Stomach is unremarkable for degree of distension. No
pathologic dilation of small or large bowel. The appendix and
terminal ileum appear normal. No evidence of acute bowel
inflammation.

Vascular/Lymphatic: No significant vascular findings are present. No
enlarged abdominal or pelvic lymph nodes.

Reproductive: Uterus is unremarkable. Bilateral tubal ligation
clips. No suspicious adnexal mass.

Other: Nodular hyperdense band within the left paramedian
subcutaneous soft tissues of the anterior pelvis along a probable
prior C-section scar measuring 2.3 cm in greatest dimension on
sagittal image 142. There is a similar appearing smaller focus
measuring 1 cm in the right paramedian subcutaneous soft tissues on
image 67/2.

Musculoskeletal: No acute or significant osseous findings.
IMPRESSION: 1. No acute intra-abdominal or pelvic findings.
2. Nodular hyperdense band within the left paramedian subcutaneous
soft tissues of the anterior pelvis along a probable prior C-section
scar measuring 2.3 cm in greatest dimension with a similar appearing
smaller focus measuring 1 cm in the right paramedian subcutaneous
soft tissues. Findings are nonspecific but in the setting of prior
C-section are suspicious for endometrial implant. Consider OB/GYN
consult for further evaluation.
